# Patient Record
Sex: Male | Born: 1980 | Hispanic: Yes | Marital: Married | Smoking: Never smoker
Health system: Southern US, Community
[De-identification: ages and names within clinical notes are randomized; demographics above are authoritative.]

---

## 2013-10-11 DIAGNOSIS — Z0000 Encounter for general adult medical examination without abnormal findings: Secondary | ICD-10-CM | POA: Insufficient documentation

## 2014-10-07 ENCOUNTER — Telehealth: Payer: Self-pay | Admitting: General Practice

## 2014-10-07 NOTE — Telephone Encounter (Signed)
You were recommended to patient and he is wanting to establish care with you.  Please advise

## 2014-10-07 NOTE — Telephone Encounter (Signed)
LMOVM

## 2014-10-07 NOTE — Telephone Encounter (Signed)
yes

## 2014-10-08 ENCOUNTER — Ambulatory Visit (INDEPENDENT_AMBULATORY_CARE_PROVIDER_SITE_OTHER): Payer: BLUE CROSS/BLUE SHIELD | Admitting: Internal Medicine

## 2014-10-08 ENCOUNTER — Encounter: Payer: Self-pay | Admitting: Internal Medicine

## 2014-10-08 ENCOUNTER — Other Ambulatory Visit (INDEPENDENT_AMBULATORY_CARE_PROVIDER_SITE_OTHER): Payer: BLUE CROSS/BLUE SHIELD

## 2014-10-08 ENCOUNTER — Ambulatory Visit (INDEPENDENT_AMBULATORY_CARE_PROVIDER_SITE_OTHER)
Admission: RE | Admit: 2014-10-08 | Discharge: 2014-10-08 | Disposition: A | Discharge status: Home / Self Care | Payer: BLUE CROSS/BLUE SHIELD | Source: Ambulatory Visit | Attending: Internal Medicine | Admitting: Internal Medicine

## 2014-10-08 VITALS — BP 120/80 | HR 56 | Temp 98.0°F | Resp 16 | Ht 70.0 in | Wt 187.0 lb

## 2014-10-08 DIAGNOSIS — R51 Headache: Secondary | ICD-10-CM

## 2014-10-08 DIAGNOSIS — B351 Tinea unguium: Secondary | ICD-10-CM

## 2014-10-08 DIAGNOSIS — M79609 Pain in unspecified limb: Secondary | ICD-10-CM

## 2014-10-08 DIAGNOSIS — R519 Headache, unspecified: Secondary | ICD-10-CM

## 2014-10-08 LAB — CBC WITH DIFFERENTIAL/PLATELET
BASOS ABS: 0 10*3/uL (ref 0.0–0.1)
BASOS PCT: 0.4 % (ref 0.0–3.0)
EOS ABS: 0.1 10*3/uL (ref 0.0–0.7)
Eosinophils Relative: 2.3 % (ref 0.0–5.0)
HCT: 42.1 % (ref 39.0–52.0)
HEMOGLOBIN: 14.4 g/dL (ref 13.0–17.0)
Lymphocytes Relative: 36.8 % (ref 12.0–46.0)
Lymphs Abs: 2.2 10*3/uL (ref 0.7–4.0)
MCHC: 34.3 g/dL (ref 30.0–36.0)
MCV: 84.1 fl (ref 78.0–100.0)
MONO ABS: 0.5 10*3/uL (ref 0.1–1.0)
Monocytes Relative: 8.3 % (ref 3.0–12.0)
NEUTROS ABS: 3.2 10*3/uL (ref 1.4–7.7)
Neutrophils Relative %: 52.2 % (ref 43.0–77.0)
Platelets: 215 10*3/uL (ref 150.0–400.0)
RBC: 5.01 Mil/uL (ref 4.22–5.81)
RDW: 12.5 % (ref 11.5–15.5)
WBC: 6.1 10*3/uL (ref 4.0–10.5)

## 2014-10-08 LAB — COMPREHENSIVE METABOLIC PANEL
ALBUMIN: 4.1 g/dL (ref 3.5–5.2)
ALK PHOS: 39 U/L (ref 39–117)
ALT: 21 U/L (ref 0–53)
AST: 14 U/L (ref 0–37)
BILIRUBIN TOTAL: 0.6 mg/dL (ref 0.2–1.2)
BUN: 16 mg/dL (ref 6–23)
CALCIUM: 9.1 mg/dL (ref 8.4–10.5)
CHLORIDE: 106 meq/L (ref 96–112)
CO2: 27 mEq/L (ref 19–32)
CREATININE: 0.88 mg/dL (ref 0.40–1.50)
GFR: 104.36 mL/min (ref >60.00)
GLUCOSE: 89 mg/dL (ref 70–99)
Potassium: 3.9 mEq/L (ref 3.5–5.1)
SODIUM: 139 meq/L (ref 135–145)
Total Protein: 7.1 g/dL (ref 6.0–8.3)

## 2014-10-08 LAB — SEDIMENTATION RATE: Sed Rate: 10 mm/hr (ref 0–22)

## 2014-10-08 MED ORDER — EFINACONAZOLE 10 % EX SOLN: 1.0000 | Freq: Every day | CUTANEOUS | Status: DC

## 2014-10-08 MED ORDER — INDOMETHACIN 25 MG PO CAPS: 25.0000 mg | ORAL_CAPSULE | Freq: Two times a day (BID) | ORAL | Status: DC

## 2014-10-08 NOTE — Progress Notes (Signed)
Pre visit review using our clinic review tool, if applicable. No additional management support is needed unless otherwise documented below in the visit note. 

## 2014-10-08 NOTE — Progress Notes (Signed)
Subjective:  Patient ID: Alexander Mccarty, male    DOB: Mar 27, 1981  Age: 34 y.o. MRN: [id number]  CC: Headache   HPI Alexander Mccarty presents for a 2 week history of right-sided headache. He says that he awakens routinely at about 4 in the morning during the night with a severe right-sided headache. He says the pain is 7 on a scale to 10 and makes him afraid to go back to sleep. There is no pain during the day. He describes the pain as a throbbing and a tightness. Sometimes the pain radiates down to behind his right ear but it is predominantly located in his right parietal region. He has tried ibuprofen with some relief but has not taken ibuprofen last few days.  History Alexander Mccarty has no past medical history on file.   He has no past surgical history on file.   His family history includes Diabetes in his father and mother; Hypertension in his father and mother. There is no history of Cancer, Early death, Hyperlipidemia, Kidney disease, Stroke, or Alcohol abuse.He reports that he has never smoked. He has never used smokeless tobacco. He reports that he does not drink alcohol or use illicit drugs.  No outpatient prescriptions prior to visit.   No facility-administered medications prior to visit.    ROS Review of Systems  Constitutional: Negative.  Negative for fever, chills, diaphoresis, activity change, appetite change, fatigue and unexpected weight change.  HENT: Negative for congestion, rhinorrhea, sinus pressure, sore throat, trouble swallowing and voice change.   Eyes: Negative.  Negative for photophobia, pain, discharge, redness, itching and visual disturbance.  Respiratory: Negative.  Negative for cough, choking, chest tightness, shortness of breath and stridor.   Cardiovascular: Negative.  Negative for chest pain, palpitations and leg swelling.  Gastrointestinal: Negative.  Negative for nausea, vomiting, abdominal pain, diarrhea and constipation.  Endocrine: Negative.   Genitourinary:  Negative.   Musculoskeletal: Negative.  Negative for myalgias, back pain, joint swelling and arthralgias.  Skin: Negative.  Negative for rash.       He is concerned about the fingernail on his right index finger. It is looked abnormal for several months.  Allergic/Immunologic: Negative.   Neurological: Positive for headaches. Negative for dizziness, tremors, seizures, syncope, facial asymmetry, speech difficulty, weakness, light-headedness and numbness.  Hematological: Negative.  Negative for adenopathy. Does not bruise/bleed easily.  Psychiatric/Behavioral: Negative.  Negative for sleep disturbance, dysphoric mood, decreased concentration and agitation. The patient is not nervous/anxious.     Objective:  BP 120/80 mmHg  Pulse 56  Temp(Src) 98 F (36.7 C) (Oral)  Resp 16  Ht 5\' 10"  (1.778 m)  Wt 187 lb (84.823 kg)  BMI 26.83 kg/m2  SpO2 98%  Physical Exam  Constitutional: He is oriented to person, place, and time. He appears well-developed and well-nourished.  Non-toxic appearance. He does not have a sickly appearance. He does not appear ill. No distress.  HENT:  Head: Normocephalic and atraumatic.  Right Ear: Hearing, tympanic membrane, external ear and ear canal normal.  Left Ear: Hearing, tympanic membrane, external ear and ear canal normal.  Mouth/Throat: Oropharynx is clear and moist. No oropharyngeal exudate.  Eyes: Conjunctivae and EOM are normal. Pupils are equal, round, and reactive to light. Right eye exhibits no discharge. Left eye exhibits no discharge. No scleral icterus.  Neck: Normal range of motion. Neck supple. No JVD present. No tracheal deviation present. No thyromegaly present.  Cardiovascular: Normal rate, regular rhythm, normal heart sounds and intact distal pulses.  Exam  reveals no gallop and no friction rub.   No murmur heard. Pulmonary/Chest: Effort normal and breath sounds normal. No stridor. No respiratory distress. He has no wheezes. He has no rales. He  exhibits no tenderness.  Abdominal: Soft. Bowel sounds are normal. He exhibits no distension and no mass. There is no tenderness. There is no rebound and no guarding.  Musculoskeletal: Normal range of motion. He exhibits no edema or tenderness.  Lymphadenopathy:    He has no cervical adenopathy.  Neurological: He is alert and oriented to person, place, and time. He has normal strength. He displays no atrophy, no tremor and normal reflexes. No cranial nerve deficit or sensory deficit. He exhibits normal muscle tone. He displays a negative Romberg sign. He displays no seizure activity. Coordination and gait normal. He displays no Babinski's sign on the right side. He displays no Babinski's sign on the left side.  Reflex Scores:      Tricep reflexes are 1+ on the right side and 1+ on the left side.      Bicep reflexes are 1+ on the right side and 1+ on the left side.      Brachioradialis reflexes are 1+ on the right side and 1+ on the left side.      Patellar reflexes are 1+ on the right side and 1+ on the left side.      Achilles reflexes are 1+ on the right side and 1+ on the left side. Skin: Skin is warm and dry. No rash noted. He is not diaphoretic. No erythema. No pallor.  The fingernail on the right index finger is dystrophic. There is lysis and a scant amount of subungual debris. There is no abnormal pigmentation. There is no erythema, swelling, warmth, or exudate.  Psychiatric: He has a normal mood and affect. His behavior is normal. Judgment and thought content normal.  Vitals reviewed.   Lab Results  Component Value Date   WBC 6.1 10/08/2014   HGB 14.4 10/08/2014   HCT 42.1 10/08/2014   PLT 215.0 10/08/2014   GLUCOSE 89 10/08/2014   ALT 21 10/08/2014   AST 14 10/08/2014   NA 139 10/08/2014   K 3.9 10/08/2014   CL 106 10/08/2014   CREATININE 0.88 10/08/2014   BUN 16 10/08/2014   CO2 27 10/08/2014    Assessment & Plan:   Alexander Mccarty was seen today for headache.  Diagnoses and  all orders for this visit:  Headache, unspecified headache type - his exam, labs and CT scan are all normal. This is consistent with a hypnic headache. He will treat with caffeine in the way of a strong cup of coffee at bedtime and will start indomethacin one-two doses at bedtime. Orders: -     CBC with Differential/Platelet; Future -     Comprehensive metabolic panel; Future -     Sedimentation rate; Future -     CT Head Wo Contrast; Future -     indomethacin (INDOCIN) 25 MG capsule; Take 1 capsule (25 mg total) by mouth 2 (two) times daily with a meal.  Pain due to onychomycosis of nail - will start Jublia Orders: -     Efinaconazole (JUBLIA) 10 % SOLN; Apply 1 Act topically daily.   I am having Alexander Mccarty start on indomethacin and Efinaconazole.  Meds ordered this encounter  Medications  . indomethacin (INDOCIN) 25 MG capsule    Sig: Take 1 capsule (25 mg total) by mouth 2 (two) times daily with a meal.  Dispense:  60 capsule    Refill:  5  . Efinaconazole (JUBLIA) 10 % SOLN    Sig: Apply 1 Act topically daily.    Dispense:  4 mL    Refill:  11     Follow-up: No Follow-up on file.  Sanda Lingerhomas Josephus Harriger, MD

## 2015-11-03 ENCOUNTER — Encounter: Payer: Self-pay | Admitting: Internal Medicine

## 2015-11-03 ENCOUNTER — Ambulatory Visit (INDEPENDENT_AMBULATORY_CARE_PROVIDER_SITE_OTHER): Payer: BLUE CROSS/BLUE SHIELD | Admitting: Internal Medicine

## 2015-11-03 VITALS — BP 124/70 | HR 61 | Temp 98.5°F | Resp 14 | Ht 70.0 in | Wt 183.8 lb

## 2015-11-03 DIAGNOSIS — J0100 Acute maxillary sinusitis, unspecified: Secondary | ICD-10-CM

## 2015-11-03 MED ORDER — AMOXICILLIN-POT CLAVULANATE 875-125 MG PO TABS: 1.0000 | ORAL_TABLET | Freq: Two times a day (BID) | ORAL | 1 refills | Status: AC

## 2015-11-03 NOTE — Progress Notes (Signed)
Subjective:  Patient ID: Alexander Mccarty, male    DOB: 1980-09-16  Age: 35 y.o. MRN: [id number]  CC: Sinusitis   HPI Alexander Mccarty presents fora 3 week history of sinus pressure, thick yellow/green nasal phlegm, sore throat, nonproductive cough, and postnasal drip. He's been taking Sudafed which has provided some symptom relief.  Outpatient Medications Prior to Visit  Medication Sig Dispense Refill  . Efinaconazole (JUBLIA) 10 % SOLN Apply 1 Act topically daily. (Patient not taking: Reported on 11/03/2015) 4 mL 11  . indomethacin (INDOCIN) 25 MG capsule Take 1 capsule (25 mg total) by mouth 2 (two) times daily with a meal. (Patient not taking: Reported on 11/03/2015) 60 capsule 5   No facility-administered medications prior to visit.     ROS Review of Systems  Constitutional: Negative.  Negative for appetite change, chills and fatigue.  HENT: Positive for congestion, postnasal drip, rhinorrhea, sinus pressure and sore throat. Negative for facial swelling, sneezing, tinnitus, trouble swallowing and voice change.   Eyes: Negative.  Negative for visual disturbance.  Respiratory: Positive for cough. Negative for choking, chest tightness, shortness of breath, wheezing and stridor.   Cardiovascular: Negative.  Negative for chest pain, palpitations and leg swelling.  Gastrointestinal: Negative.  Negative for abdominal pain.  Endocrine: Negative.   Genitourinary: Negative.   Musculoskeletal: Negative.   Skin: Negative.  Negative for rash.  Allergic/Immunologic: Negative.   Neurological: Negative.   Hematological: Negative.  Negative for adenopathy. Does not bruise/bleed easily.  Psychiatric/Behavioral: Negative.     Objective:  BP 124/70   Pulse 61   Temp 98.5 F (36.9 C) (Oral)   Resp 14   Ht  (1.778 m)   Wt 183 lb 12.8 oz (83.4 kg)   SpO2 97%   BMI 26.37 kg/m   BP Readings from Last 3 Encounters:  11/03/15 124/70  10/08/14 120/80    Wt Readings from Last 3 Encounters:   11/03/15 183 lb 12.8 oz (83.4 kg)  10/08/14 187 lb (84.8 kg)    Physical Exam  Constitutional: He is oriented to person, place, and time. No distress.  HENT:  Right Ear: Hearing, external ear and ear canal normal.  Left Ear: Hearing, tympanic membrane, external ear and ear canal normal.  Nose: No mucosal edema or rhinorrhea. Right sinus exhibits maxillary sinus tenderness. Right sinus exhibits no frontal sinus tenderness. Left sinus exhibits no maxillary sinus tenderness and no frontal sinus tenderness.  Mouth/Throat: No trismus in the jaw. No uvula swelling. Posterior oropharyngeal erythema present. No oropharyngeal exudate, posterior oropharyngeal edema or tonsillar abscesses.  Eyes: Conjunctivae are normal. Right eye exhibits no discharge. Left eye exhibits no discharge. No scleral icterus.  Neck: Normal range of motion. Neck supple. No JVD present. No tracheal deviation present. No thyromegaly present.  Cardiovascular: Normal rate, regular rhythm, normal heart sounds and intact distal pulses.  Exam reveals no gallop and no friction rub.   No murmur heard. Pulmonary/Chest: Effort normal and breath sounds normal. No stridor. No respiratory distress. He has no wheezes. He has no rales. He exhibits no tenderness.  Abdominal: Soft. He exhibits no distension and no mass. There is no tenderness. There is no rebound and no guarding.  Musculoskeletal: Normal range of motion. He exhibits no edema, tenderness or deformity.  Lymphadenopathy:    He has no cervical adenopathy.  Neurological: He is oriented to person, place, and time.  Skin: Skin is warm and dry. No rash noted. He is not diaphoretic. No erythema.  Vitals reviewed.  Lab Results  Component Value Date   WBC 6.1 10/08/2014   HGB 14.4 10/08/2014   HCT 42.1 10/08/2014   PLT 215.0 10/08/2014   GLUCOSE 89 10/08/2014   ALT 21 10/08/2014   AST 14 10/08/2014   NA 139 10/08/2014   K 3.9 10/08/2014   CL 106 10/08/2014   CREATININE  0.88 10/08/2014   BUN 16 10/08/2014   CO2 27 10/08/2014    Ct Head Wo Contrast  Result Date: 10/08/2014 CLINICAL DATA:  Right site headache for 2 weeks EXAM: CT HEAD WITHOUT CONTRAST TECHNIQUE: Contiguous axial images were obtained from the base of the skull through the vertex without intravenous contrast. COMPARISON:  None. FINDINGS: No skull fracture is noted. Paranasal sinuses and mastoid air cells are unremarkable. No intracranial hemorrhage, mass effect or midline shift. No acute cortical infarction. No hydrocephalus. The gray and white-matter differentiation is preserved. No mass lesion is noted on this unenhanced scan. IMPRESSION: No acute intracranial abnormality. Electronically Signed   By: Natasha MeadLiviu  Pop M.D.   On: 10/08/2014 15:00    Assessment & Plan:   Alexander Mccarty was seen today for sinusitis.  Diagnoses and all orders for this visit:  Acute maxillary sinusitis, recurrence not specified -     amoxicillin-clavulanate (AUGMENTIN) 875-125 MG tablet; Take 1 tablet by mouth 2 (two) times daily.   I am having Alexander Mccarty start on amoxicillin-clavulanate. I am also having him maintain his indomethacin and Efinaconazole.  Meds ordered this encounter  Medications  . amoxicillin-clavulanate (AUGMENTIN) 875-125 MG tablet    Sig: Take 1 tablet by mouth 2 (two) times daily.    Dispense:  20 tablet    Refill:  1     Follow-up: Return if symptoms worsen or fail to improve.  Sanda Lingerhomas Federico Maiorino, MD

## 2015-11-03 NOTE — Progress Notes (Signed)
Pre visit review using our clinic review tool, if applicable. No additional management support is needed unless otherwise documented below in the visit note. 

## 2015-11-03 NOTE — Patient Instructions (Signed)

## 2016-03-17 DIAGNOSIS — M545 Low back pain: Secondary | ICD-10-CM | POA: Diagnosis not present

## 2016-03-17 DIAGNOSIS — M9902 Segmental and somatic dysfunction of thoracic region: Secondary | ICD-10-CM | POA: Diagnosis not present

## 2016-03-17 DIAGNOSIS — M546 Pain in thoracic spine: Secondary | ICD-10-CM | POA: Diagnosis not present

## 2016-03-17 DIAGNOSIS — M9903 Segmental and somatic dysfunction of lumbar region: Secondary | ICD-10-CM | POA: Diagnosis not present

## 2016-03-30 DIAGNOSIS — M546 Pain in thoracic spine: Secondary | ICD-10-CM | POA: Diagnosis not present

## 2016-03-30 DIAGNOSIS — M545 Low back pain: Secondary | ICD-10-CM | POA: Diagnosis not present

## 2016-03-30 DIAGNOSIS — M9902 Segmental and somatic dysfunction of thoracic region: Secondary | ICD-10-CM | POA: Diagnosis not present

## 2016-03-30 DIAGNOSIS — M9903 Segmental and somatic dysfunction of lumbar region: Secondary | ICD-10-CM | POA: Diagnosis not present

## 2016-04-05 ENCOUNTER — Encounter: Payer: Self-pay | Admitting: Internal Medicine

## 2016-04-06 ENCOUNTER — Ambulatory Visit (INDEPENDENT_AMBULATORY_CARE_PROVIDER_SITE_OTHER): Payer: BLUE CROSS/BLUE SHIELD | Admitting: Internal Medicine

## 2016-04-06 ENCOUNTER — Ambulatory Visit (INDEPENDENT_AMBULATORY_CARE_PROVIDER_SITE_OTHER)
Admission: RE | Admit: 2016-04-06 | Discharge: 2016-04-06 | Disposition: A | Discharge status: Home / Self Care | Payer: BLUE CROSS/BLUE SHIELD | Source: Ambulatory Visit | Attending: Internal Medicine | Admitting: Internal Medicine

## 2016-04-06 ENCOUNTER — Encounter: Payer: Self-pay | Admitting: Internal Medicine

## 2016-04-06 ENCOUNTER — Other Ambulatory Visit (INDEPENDENT_AMBULATORY_CARE_PROVIDER_SITE_OTHER): Payer: BLUE CROSS/BLUE SHIELD

## 2016-04-06 VITALS — BP 122/82 | HR 81 | Temp 98.7°F | Resp 16 | Ht 70.0 in | Wt 190.8 lb

## 2016-04-06 DIAGNOSIS — R93422 Abnormal radiologic findings on diagnostic imaging of left kidney: Secondary | ICD-10-CM | POA: Diagnosis not present

## 2016-04-06 DIAGNOSIS — J069 Acute upper respiratory infection, unspecified: Secondary | ICD-10-CM | POA: Insufficient documentation

## 2016-04-06 DIAGNOSIS — Z23 Encounter for immunization: Secondary | ICD-10-CM

## 2016-04-06 DIAGNOSIS — R05 Cough: Secondary | ICD-10-CM

## 2016-04-06 DIAGNOSIS — R1032 Left lower quadrant pain: Secondary | ICD-10-CM

## 2016-04-06 DIAGNOSIS — R109 Unspecified abdominal pain: Secondary | ICD-10-CM | POA: Diagnosis not present

## 2016-04-06 DIAGNOSIS — B9789 Other viral agents as the cause of diseases classified elsewhere: Secondary | ICD-10-CM

## 2016-04-06 DIAGNOSIS — R059 Cough, unspecified: Secondary | ICD-10-CM

## 2016-04-06 LAB — COMPREHENSIVE METABOLIC PANEL
ALT: 34 U/L (ref 0–53)
AST: 19 U/L (ref 0–37)
Albumin: 4.3 g/dL (ref 3.5–5.2)
Alkaline Phosphatase: 42 U/L (ref 39–117)
BILIRUBIN TOTAL: 0.5 mg/dL (ref 0.2–1.2)
BUN: 17 mg/dL (ref 6–23)
CHLORIDE: 106 meq/L (ref 96–112)
CO2: 28 meq/L (ref 19–32)
CREATININE: 0.89 mg/dL (ref 0.40–1.50)
Calcium: 9.3 mg/dL (ref 8.4–10.5)
GFR: 102.15 mL/min (ref >60.00)
GLUCOSE: 89 mg/dL (ref 70–99)
Potassium: 4.2 mEq/L (ref 3.5–5.1)
SODIUM: 141 meq/L (ref 135–145)
Total Protein: 7.3 g/dL (ref 6.0–8.3)

## 2016-04-06 LAB — CBC WITH DIFFERENTIAL/PLATELET
BASOS ABS: 0 10*3/uL (ref 0.0–0.1)
BASOS PCT: 0.6 % (ref 0.0–3.0)
EOS ABS: 0.1 10*3/uL (ref 0.0–0.7)
Eosinophils Relative: 2.1 % (ref 0.0–5.0)
HEMATOCRIT: 43.2 % (ref 39.0–52.0)
Hemoglobin: 14.9 g/dL (ref 13.0–17.0)
LYMPHS ABS: 3 10*3/uL (ref 0.7–4.0)
Lymphocytes Relative: 43.5 % (ref 12.0–46.0)
MCHC: 34.4 g/dL (ref 30.0–36.0)
MCV: 83.2 fl (ref 78.0–100.0)
Monocytes Absolute: 0.4 10*3/uL (ref 0.1–1.0)
Monocytes Relative: 6.5 % (ref 3.0–12.0)
NEUTROS ABS: 3.3 10*3/uL (ref 1.4–7.7)
NEUTROS PCT: 47.3 % (ref 43.0–77.0)
Platelets: 201 10*3/uL (ref 150.0–400.0)
RBC: 5.2 Mil/uL (ref 4.22–5.81)
RDW: 12.2 % (ref 11.5–15.5)
WBC: 6.9 10*3/uL (ref 4.0–10.5)

## 2016-04-06 LAB — URINALYSIS, ROUTINE W REFLEX MICROSCOPIC
BILIRUBIN URINE: NEGATIVE
Hgb urine dipstick: NEGATIVE
Ketones, ur: NEGATIVE
Leukocytes, UA: NEGATIVE
Nitrite: NEGATIVE
PH: 6.5 (ref 5.0–8.0)
RBC / HPF: NONE SEEN (ref 0–?)
SPECIFIC GRAVITY, URINE: 1.015 (ref 1.000–1.030)
TOTAL PROTEIN, URINE-UPE24: NEGATIVE
UROBILINOGEN UA: 0.2 (ref 0.0–1.0)
Urine Glucose: NEGATIVE
WBC UA: NONE SEEN (ref 0–?)

## 2016-04-06 LAB — AMYLASE: Amylase: 27 U/L (ref 27–131)

## 2016-04-06 LAB — SEDIMENTATION RATE: SED RATE: 8 mm/h (ref 0–15)

## 2016-04-06 LAB — LIPASE: LIPASE: 43 U/L (ref 11.0–59.0)

## 2016-04-06 LAB — C-REACTIVE PROTEIN: CRP: 0.1 mg/dL — ABNORMAL LOW (ref 0.5–20.0)

## 2016-04-06 MED ORDER — HYDROCODONE-HOMATROPINE 5-1.5 MG/5ML PO SYRP
5.0000 mL | ORAL_SOLUTION | Freq: Three times a day (TID) | ORAL | 0 refills | Status: DC | PRN
Start: 2016-04-06 — End: 2017-02-09

## 2016-04-06 NOTE — Progress Notes (Signed)
Subjective:  Patient ID: Alexander Mccarty, male    DOB: [birth year]  Age: 36 y.o. MRN: [id number]  CC: Cough and Abdominal Pain   HPI Alexander Mccarty presents for feeling poorly for about 2 weeks. 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 Nonetheless, he tells me with some back manipulations from the chiropractor his back pain is getting better but he has also had a mild, nonproductive cough and sneezing for about 10 days. The cough keeps him awake at night and aggravates his low back pain. He also reports a few episodes of discomfort in his left flank and left lower abdomen that he describes as a mild aching and tight sensation. He has had a few episodes of constipation but he denies loss of appetite, nausea, vomiting, diarrhea, rash, dysuria, hematuria, or lymphadenopathy. He has been taking Motrin for his discomforts and has gotten some relief.  Outpatient Medications Prior to Visit  Medication Sig Dispense Refill  . Efinaconazole (JUBLIA) 10 % SOLN Apply 1 Act topically daily. (Patient not taking: Reported on 11/03/2015) 4 mL 11  . indomethacin (INDOCIN) 25 MG capsule Take 1 capsule (25 mg total) by mouth 2 (two) times daily with a meal. (Patient not taking: Reported on 11/03/2015) 60 capsule 5   No facility-administered medications prior to visit.     ROS Review of Systems  Constitutional: Negative for activity change, appetite change, chills, diaphoresis, fatigue, fever and unexpected weight change.  HENT: Negative.  Negative for facial swelling, sinus pressure, sore throat and trouble swallowing.   Eyes: Negative for visual disturbance.  Respiratory: Positive for cough. Negative for chest tightness, shortness of breath and wheezing.   Cardiovascular: Negative.  Negative for chest pain, palpitations and leg  swelling.  Gastrointestinal: Positive for abdominal pain and constipation. Negative for abdominal distention, anal bleeding, blood in stool, diarrhea, nausea, rectal pain and vomiting.  Endocrine: Negative.   Genitourinary: Negative.  Negative for decreased urine volume, difficulty urinating, dysuria, flank pain, hematuria, penile swelling, scrotal swelling, testicular pain and urgency.  Musculoskeletal: Positive for back pain. Negative for arthralgias, joint swelling, myalgias and neck pain.  Skin: Negative.  Negative for color change, pallor and rash.  Allergic/Immunologic: Negative.   Neurological: Negative.  Negative for dizziness, weakness, light-headedness and headaches.  Hematological: Negative.  Negative for adenopathy. Does not bruise/bleed easily.  Psychiatric/Behavioral: Negative.     Objective:  BP 122/82   Pulse 81   Temp 98.7 F (37.1 C) (Oral)   Resp 16   Ht 5\' 10"  (1.778 m)   Wt 190 lb 12.8 oz (86.5 kg)   SpO2 98%   BMI 27.38 kg/m   BP Readings from Last 3 Encounters:  04/06/16 122/82  11/03/15 124/70  10/08/14 120/80    Wt Readings from Last 3 Encounters:  04/06/16 190 lb 12.8 oz (86.5 kg)  11/03/15 183 lb 12.8 oz (83.4 kg)  10/08/14 187 lb (84.8 kg)    Physical Exam  Constitutional: He is oriented to person, place, and time.  Non-toxic appearance. He does not have a sickly appearance. He does not appear ill. No distress.  HENT:  Mouth/Throat: Oropharynx is clear and moist. No oropharyngeal exudate.  Eyes: Conjunctivae are normal. Right eye exhibits no discharge. Left eye exhibits no discharge. No scleral icterus.  Neck: Normal range of motion. Neck supple. No JVD present. No tracheal deviation present. No thyromegaly present.  Cardiovascular: Normal rate, regular rhythm, normal heart sounds and intact distal pulses.  Exam reveals no gallop and no friction rub.   No murmur heard. Pulmonary/Chest: Effort normal and breath sounds normal. No stridor. No  respiratory distress. He has no wheezes. He has no rales. He exhibits no tenderness.  Abdominal: Soft. Normal appearance and bowel sounds are normal. He exhibits no shifting dullness, no distension, no pulsatile midline mass and no mass. There is no hepatosplenomegaly, splenomegaly or hepatomegaly. There is no tenderness. There is no rebound, no guarding and no CVA tenderness. No hernia. Hernia confirmed negative in the ventral area.  Musculoskeletal: Normal range of motion. He exhibits no edema, tenderness or deformity.  Lymphadenopathy:    He has no cervical adenopathy.  Neurological: He is oriented to person, place, and time.  Skin: Skin is warm and dry. No rash noted. He is not diaphoretic. No erythema. No pallor.  Psychiatric: He has a normal mood and affect. His behavior is normal. Judgment and thought content normal.  Vitals reviewed.   Lab Results  Component Value Date   WBC 6.9 04/06/2016   HGB 14.9 04/06/2016   HCT 43.2 04/06/2016   PLT 201.0 04/06/2016   GLUCOSE 89 04/06/2016   ALT 34 04/06/2016   AST 19 04/06/2016   NA 141 04/06/2016   K 4.2 04/06/2016   CL 106 04/06/2016   CREATININE 0.89 04/06/2016   BUN 17 04/06/2016   CO2 28 04/06/2016    Ct Head Wo Contrast  Result Date: 10/08/2014 CLINICAL DATA:  Right site headache for 2 weeks EXAM: CT HEAD WITHOUT CONTRAST TECHNIQUE: Contiguous axial images were obtained from the base of the skull through the vertex without intravenous contrast. COMPARISON:  None. FINDINGS: No skull fracture is noted. Paranasal sinuses and mastoid air cells are unremarkable. No intracranial hemorrhage, mass effect or midline shift. No acute cortical infarction. No hydrocephalus. The gray and white-matter differentiation is preserved. No mass lesion is noted on this unenhanced scan. IMPRESSION: No acute intracranial abnormality. Electronically Signed   By: Natasha Mead M.D.   On: 10/08/2014 15:00    Assessment & Plan:   [name] was seen today for  cough and abdominal pain.  Diagnoses and all orders for this visit:  Cough- his chest x-ray is normal, this is consistent with a viral URI. -     DG Abd Acute W/Chest; Future -     HYDROcodone-homatropine (HYCODAN) 5-1.5 MG/5ML syrup; Take 5 mLs by mouth every 8 (eight) hours as needed for cough.  Abdominal pain, left lower quadrant- his exam is normal, his labs are all normal, but his plain x-ray shows vague calcifications around the left kidney. I will get a CT/renal protocol to see if there is a mass in the left kidney or a kidney stone that could be causing his discomfort. Otherwise I do not see any evidence of abdominal pathology. -     Lipase; Future -     Comprehensive metabolic panel; Future -     CBC with Differential/Platelet; Future -     Amylase; Future -     Urinalysis, Routine w reflex microscopic; Future -     DG Abd Acute W/Chest; Future -     C-reactive protein; Future -     Sedimentation rate; Future -     CT RENAL STONE STUDY; Future  Viral upper respiratory tract infection with cough -  HYDROcodone-homatropine (HYCODAN) 5-1.5 MG/5ML syrup; Take 5 mLs by mouth every 8 (eight) hours as needed for cough.  Abnormal finding on diagnostic imaging of left kidney -     CT RENAL STONE STUDY; Future  Need for prophylactic vaccination and inoculation against influenza -     Flu Vaccine QUAD 36+ mos IM  Need for prophylactic vaccination with combined diphtheria-tetanus-pertussis (DTP) vaccine -     Tdap vaccine greater than or equal to 7yo IM   I have discontinued Mr. Rica MoteBosch's indomethacin, Efinaconazole, and Ibuprofen. I am also having him start on HYDROcodone-homatropine.  Meds ordered this encounter  Medications  . DISCONTD: Ibuprofen 200 MG CAPS  . HYDROcodone-homatropine (HYCODAN) 5-1.5 MG/5ML syrup    Sig: Take 5 mLs by mouth every 8 (eight) hours as needed for cough.    Dispense:  120 mL    Refill:  0     Follow-up: Return in about 3 weeks (around  04/27/2016).  Sanda Lingerhomas [place] Duquette, MD

## 2016-04-06 NOTE — Progress Notes (Signed)
Pre visit review using our clinic review tool, if applicable. No additional management support is needed unless otherwise documented below in the visit note. 

## 2016-04-06 NOTE — Patient Instructions (Signed)

## 2016-04-07 NOTE — Telephone Encounter (Signed)
Pt attached the images in the Review Media tab.

## 2016-04-08 ENCOUNTER — Other Ambulatory Visit: Payer: Self-pay | Admitting: Internal Medicine

## 2016-04-08 DIAGNOSIS — R93422 Abnormal radiologic findings on diagnostic imaging of left kidney: Secondary | ICD-10-CM

## 2016-04-08 DIAGNOSIS — R1032 Left lower quadrant pain: Secondary | ICD-10-CM

## 2016-04-09 ENCOUNTER — Inpatient Hospital Stay: Admission: RE | Admit: 2016-04-09 | Payer: BLUE CROSS/BLUE SHIELD | Source: Ambulatory Visit

## 2016-04-19 ENCOUNTER — Ambulatory Visit (INDEPENDENT_AMBULATORY_CARE_PROVIDER_SITE_OTHER)
Admission: RE | Admit: 2016-04-19 | Discharge: 2016-04-19 | Disposition: A | Discharge status: Home / Self Care | Payer: BLUE CROSS/BLUE SHIELD | Source: Ambulatory Visit | Attending: Internal Medicine | Admitting: Internal Medicine

## 2016-04-19 ENCOUNTER — Encounter: Payer: Self-pay | Admitting: Internal Medicine

## 2016-04-19 DIAGNOSIS — R93422 Abnormal radiologic findings on diagnostic imaging of left kidney: Secondary | ICD-10-CM | POA: Diagnosis not present

## 2016-04-19 DIAGNOSIS — R1032 Left lower quadrant pain: Secondary | ICD-10-CM | POA: Diagnosis not present

## 2016-04-19 MED ORDER — IOPAMIDOL (ISOVUE-300) INJECTION 61%
100.0000 mL | Freq: Once | INTRAVENOUS | Status: AC | PRN
  Administered 2016-04-19: 100 mL via INTRAVENOUS

## 2016-04-27 ENCOUNTER — Ambulatory Visit: Payer: BLUE CROSS/BLUE SHIELD | Admitting: Internal Medicine

## 2017-02-09 ENCOUNTER — Other Ambulatory Visit (INDEPENDENT_AMBULATORY_CARE_PROVIDER_SITE_OTHER): Payer: BLUE CROSS/BLUE SHIELD

## 2017-02-09 ENCOUNTER — Encounter: Payer: Self-pay | Admitting: Internal Medicine

## 2017-02-09 ENCOUNTER — Ambulatory Visit: Payer: BLUE CROSS/BLUE SHIELD | Admitting: Internal Medicine

## 2017-02-09 VITALS — BP 128/82 | HR 74 | Temp 98.4°F | Ht 70.0 in | Wt 189.0 lb

## 2017-02-09 DIAGNOSIS — Z7251 High risk heterosexual behavior: Secondary | ICD-10-CM

## 2017-02-09 DIAGNOSIS — Z23 Encounter for immunization: Secondary | ICD-10-CM

## 2017-02-09 DIAGNOSIS — R310 Gross hematuria: Secondary | ICD-10-CM

## 2017-02-09 DIAGNOSIS — Z0000 Encounter for general adult medical examination without abnormal findings: Secondary | ICD-10-CM

## 2017-02-09 DIAGNOSIS — N410 Acute prostatitis: Secondary | ICD-10-CM | POA: Diagnosis not present

## 2017-02-09 LAB — CBC WITH DIFFERENTIAL/PLATELET
BASOS ABS: 0 10*3/uL (ref 0.0–0.1)
Basophils Relative: 0.7 % (ref 0.0–3.0)
EOS ABS: 0.1 10*3/uL (ref 0.0–0.7)
Eosinophils Relative: 2.4 % (ref 0.0–5.0)
HEMATOCRIT: 46 % (ref 39.0–52.0)
HEMOGLOBIN: 15.4 g/dL (ref 13.0–17.0)
LYMPHS PCT: 44 % (ref 12.0–46.0)
Lymphs Abs: 2.1 10*3/uL (ref 0.7–4.0)
MCHC: 33.4 g/dL (ref 30.0–36.0)
MCV: 86.6 fl (ref 78.0–100.0)
MONO ABS: 0.4 10*3/uL (ref 0.1–1.0)
Monocytes Relative: 8.1 % (ref 3.0–12.0)
Neutro Abs: 2.1 10*3/uL (ref 1.4–7.7)
Neutrophils Relative %: 44.8 % (ref 43.0–77.0)
PLATELETS: 206 10*3/uL (ref 150.0–400.0)
RBC: 5.32 Mil/uL (ref 4.22–5.81)
RDW: 13.2 % (ref 11.5–15.5)
WBC: 4.8 10*3/uL (ref 4.0–10.5)

## 2017-02-09 LAB — URINALYSIS, ROUTINE W REFLEX MICROSCOPIC
BILIRUBIN URINE: NEGATIVE
KETONES UR: NEGATIVE
LEUKOCYTES UA: NEGATIVE
NITRITE: NEGATIVE
Specific Gravity, Urine: 1.015 (ref 1.000–1.030)
Total Protein, Urine: NEGATIVE
URINE GLUCOSE: NEGATIVE
UROBILINOGEN UA: 0.2 (ref 0.0–1.0)
WBC UA: NONE SEEN (ref 0–?)
pH: 6.5 (ref 5.0–8.0)

## 2017-02-09 LAB — LIPID PANEL
Cholesterol: 170 mg/dL (ref 0–200)
HDL: 35.3 mg/dL — AB (ref >39.00)
LDL Cholesterol: 120 mg/dL — ABNORMAL HIGH (ref 0–99)
NONHDL: 134.79
TRIGLYCERIDES: 76 mg/dL (ref 0.0–149.0)
Total CHOL/HDL Ratio: 5
VLDL: 15.2 mg/dL (ref 0.0–40.0)

## 2017-02-09 LAB — COMPREHENSIVE METABOLIC PANEL
ALBUMIN: 4.3 g/dL (ref 3.5–5.2)
ALT: 33 U/L (ref 0–53)
AST: 20 U/L (ref 0–37)
Alkaline Phosphatase: 33 U/L — ABNORMAL LOW (ref 39–117)
BILIRUBIN TOTAL: 0.8 mg/dL (ref 0.2–1.2)
BUN: 11 mg/dL (ref 6–23)
CALCIUM: 9.5 mg/dL (ref 8.4–10.5)
CHLORIDE: 103 meq/L (ref 96–112)
CO2: 29 meq/L (ref 19–32)
CREATININE: 0.87 mg/dL (ref 0.40–1.50)
GFR: 104.39 mL/min (ref >60.00)
Glucose, Bld: 97 mg/dL (ref 70–99)
Potassium: 3.7 mEq/L (ref 3.5–5.1)
SODIUM: 138 meq/L (ref 135–145)
Total Protein: 7.3 g/dL (ref 6.0–8.3)

## 2017-02-09 MED ORDER — DOXYCYCLINE MONOHYDRATE 100 MG PO CAPS: 100.0000 mg | ORAL_CAPSULE | Freq: Two times a day (BID) | ORAL | 1 refills | Status: AC

## 2017-02-09 NOTE — Progress Notes (Signed)
Subjective:  Patient ID: Alexander Mccarty, male    DOB: Sep 14, 1980  Age: 36 y.o. MRN: [id number]  CC: Other (Patient is here today for STD screening and states that he has this completed yearly.  On 11.08.2018 he had hematuria but has since resolve.  Would also like to disucss Gardisil Vaccine. Patient is not currently fasting.) and Annual Exam   HPI Alexander Mccarty presents for a CPX.  He had an episode of gross hematuria about a week ago.  He is not monogamous with his partner and is concerned about STI's.  He denies abdominal pain, flank pain, dysuria, genital lesions, nocturia, or frequency.  Outpatient Medications Prior to Visit  Medication Sig Dispense Refill  . HYDROcodone-homatropine (HYCODAN) 5-1.5 MG/5ML syrup Take 5 mLs by mouth every 8 (eight) hours as needed for cough. 120 mL 0   No facility-administered medications prior to visit.     ROS Review of Systems  Constitutional: Negative.  Negative for appetite change, chills, fatigue, fever and unexpected weight change.  HENT: Negative.  Negative for sinus pressure, sore throat and trouble swallowing.   Eyes: Negative.   Respiratory: Negative.  Negative for cough, chest tightness, shortness of breath and wheezing.   Cardiovascular: Negative for chest pain, palpitations and leg swelling.  Gastrointestinal: Negative for abdominal pain, diarrhea, nausea and vomiting.  Genitourinary: Positive for hematuria. Negative for difficulty urinating, discharge, dysuria, flank pain, genital sores, penile pain, penile swelling, scrotal swelling, testicular pain and urgency.  Musculoskeletal: Negative.  Negative for back pain.  Skin: Negative.   Neurological: Negative.   Hematological: Negative for adenopathy. Does not bruise/bleed easily.  Psychiatric/Behavioral: Negative.     Objective:  BP 128/82 (BP Location: Left Arm, Patient Position: Sitting, Cuff Size: Normal)   Pulse 74   Temp 98.4 F (36.9 C) (Oral)   Ht 5\' 10"  (1.778 m)   Wt  189 lb 0.6 oz (85.7 kg)   SpO2 98%   BMI 27.12 kg/m   BP Readings from Last 3 Encounters:  02/09/17 128/82  04/06/16 122/82  11/03/15 124/70    Wt Readings from Last 3 Encounters:  02/09/17 189 lb 0.6 oz (85.7 kg)  04/06/16 190 lb 12.8 oz (86.5 kg)  11/03/15 183 lb 12.8 oz (83.4 kg)    Physical Exam  Constitutional: No distress.  HENT:  Mouth/Throat: Oropharynx is clear and moist. No oropharyngeal exudate.  Eyes: Conjunctivae are normal. Right eye exhibits no discharge. Left eye exhibits no discharge. No scleral icterus.  Neck: Normal range of motion. Neck supple. No JVD present. No thyromegaly present.  Cardiovascular: Normal rate, regular rhythm and intact distal pulses. Exam reveals no gallop and no friction rub.  No murmur heard. Pulmonary/Chest: Effort normal and breath sounds normal. No respiratory distress. He has no wheezes. He has no rales. He exhibits no tenderness.  Abdominal: Soft. Bowel sounds are normal. He exhibits no distension and no mass. There is no tenderness. There is no rebound and no guarding. Hernia confirmed negative in the right inguinal area and confirmed negative in the left inguinal area.  Genitourinary: Testes normal and penis normal. Rectal exam shows no external hemorrhoid, no internal hemorrhoid, no fissure, no mass, no tenderness, anal tone normal and guaiac negative stool. Prostate is enlarged. Prostate is not tender. Right testis shows no mass, no swelling and no tenderness. Right testis is descended. Left testis shows no mass, no swelling and no tenderness. Left testis is descended. Circumcised. No penile erythema or penile tenderness. No discharge found.  Genitourinary Comments: Prostate gland, right lobe is larger than the left lobe and its boggy.  Lymphadenopathy:    He has no cervical adenopathy.       Right: No inguinal adenopathy present.       Left: No inguinal adenopathy present.  Skin: He is not diaphoretic.  Vitals reviewed.   Lab  Results  Component Value Date   WBC 4.8 02/09/2017   HGB 15.4 02/09/2017   HCT 46.0 02/09/2017   PLT 206.0 02/09/2017   GLUCOSE 97 02/09/2017   CHOL 170 02/09/2017   TRIG 76.0 02/09/2017   HDL 35.30 (L) 02/09/2017   LDLCALC 120 (H) 02/09/2017   ALT 33 02/09/2017   AST 20 02/09/2017   NA 138 02/09/2017   K 3.7 02/09/2017   CL 103 02/09/2017   CREATININE 0.87 02/09/2017   BUN 11 02/09/2017   CO2 29 02/09/2017    Ct Abdomen Pelvis W Wo Contrast  Result Date: 04/19/2016 CLINICAL DATA:  Left lower quadrant pain. Possible left renal calculi on recent abdominal radiograph. EXAM: CT ABDOMEN AND PELVIS WITHOUT AND WITH CONTRAST TECHNIQUE: Multidetector CT imaging of the abdomen and pelvis was performed following the standard protocol before and following the bolus administration of intravenous contrast. CONTRAST:  ISOVUE-300 IOPAMIDOL (ISOVUE-300) INJECTION 61% COMPARISON:  None. FINDINGS: Lower Chest: No acute findings. Hepatobiliary:  No masses identified. Gallbladder is unremarkable. Pancreas:  No mass or inflammatory changes. Spleen: Within normal limits in size and appearance. Adrenals/Urinary Tract: No adrenal masses identified. No evidence of urolithiasis or hydronephrosis. No complex cystic or solid renal masses are identified. Unopacified urinary bladder is unremarkable in appearance. Stomach/Bowel: No evidence of obstruction, inflammatory process or abnormal fluid collections. Normal appendix visualized. Vascular/Lymphatic: No pathologically enlarged lymph nodes. No abdominal aortic aneurysm. Reproductive:  No mass identified. Other:  None. Musculoskeletal:  No suspicious bone lesions identified. IMPRESSION: Negative. No evidence of urolithiasis, hydronephrosis, or other acute findings. Electronically Signed   By: Alexander Mccarty M.D.   On: 04/19/2016 16:16    Assessment & Plan:   Alexander Mccarty was seen today for other.  Diagnoses and all orders for this visit:  Routine general medical  examination at a health care facility- Exam completed, labs reviewed, vaccines reviewed and updated, patient education material was given. -     Lipid panel; Future  Gross hematuria- His urinalysis is positive for hematuria.  Screening for gonorrhea and chlamydia is negative.  Urine culture is pending.  Will empirically treat for acute prostatitis with doxycycline. -     Comprehensive metabolic panel; Future -     CBC with Differential/Platelet; Future -     Urinalysis, Routine w reflex microscopic; Future -     GC/Chlamydia Probe Amp; Future -     CULTURE, URINE COMPREHENSIVE; Future  High risk heterosexual behavior- Screening for sexually transmitted infections is negative.  He was advised regarding safe sexual practices.  He has no protective antibodies to Hep A and B so I asked him to return to start the vaccine process. -     Hepatitis A antibody, total; Future -     Hepatitis B core antibody, total; Future -     Hepatitis B surface antibody; Future -     HIV antibody; Future -     HSV(herpes simplex vrs) 1+2 ab-IgG; Future -     RPR; Future  Need for HPV vaccination -     HPV 9-valent vaccine,Recombinat   I have discontinued Alexander Mccarty "Marquie Antonio"'s HYDROcodone-homatropine. I  am also having him start on doxycycline.  Meds ordered this encounter  Medications  . doxycycline (MONODOX) 100 MG capsule    Sig: Take 1 capsule (100 mg total) 2 (two) times daily by mouth.    Dispense:  60 capsule    Refill:  1     Follow-up: No Follow-up on file.  Sanda Lingerhomas Maddock Finigan, MD

## 2017-02-09 NOTE — Patient Instructions (Signed)

## 2017-02-10 ENCOUNTER — Encounter: Payer: Self-pay | Admitting: Internal Medicine

## 2017-02-10 LAB — RPR: RPR: NONREACTIVE

## 2017-02-10 LAB — HSV(HERPES SIMPLEX VRS) I + II AB-IGG: HAV 1 IGG,TYPE SPECIFIC AB: <0.9 index

## 2017-02-10 LAB — HEPATITIS B CORE ANTIBODY, TOTAL: HEP B C TOTAL AB: NONREACTIVE

## 2017-02-10 LAB — HIV ANTIBODY (ROUTINE TESTING W REFLEX): HIV: NONREACTIVE

## 2017-02-10 LAB — GC/CHLAMYDIA PROBE AMP
Chlamydia trachomatis, NAA: NEGATIVE
Neisseria gonorrhoeae by PCR: NEGATIVE

## 2017-02-10 LAB — HEPATITIS A ANTIBODY, TOTAL: HEPATITIS A AB,TOTAL: NONREACTIVE

## 2017-02-10 LAB — HEPATITIS B SURFACE ANTIBODY,QUALITATIVE: Hep B S Ab: NONREACTIVE

## 2017-02-11 LAB — CULTURE, URINE COMPREHENSIVE
MICRO NUMBER: 81283914
RESULT: NO GROWTH
SPECIMEN QUALITY:: ADEQUATE

## 2017-02-22 ENCOUNTER — Ambulatory Visit: Payer: Self-pay | Admitting: Internal Medicine

## 2017-02-22 ENCOUNTER — Ambulatory Visit (INDEPENDENT_AMBULATORY_CARE_PROVIDER_SITE_OTHER): Payer: BLUE CROSS/BLUE SHIELD

## 2017-02-22 DIAGNOSIS — Z299 Encounter for prophylactic measures, unspecified: Secondary | ICD-10-CM

## 2017-02-22 DIAGNOSIS — Z23 Encounter for immunization: Secondary | ICD-10-CM

## 2017-03-25 ENCOUNTER — Ambulatory Visit (INDEPENDENT_AMBULATORY_CARE_PROVIDER_SITE_OTHER): Payer: BLUE CROSS/BLUE SHIELD

## 2017-03-25 DIAGNOSIS — Z299 Encounter for prophylactic measures, unspecified: Secondary | ICD-10-CM

## 2017-03-25 DIAGNOSIS — Z23 Encounter for immunization: Secondary | ICD-10-CM | POA: Diagnosis not present

## 2017-04-11 ENCOUNTER — Encounter: Payer: Self-pay | Admitting: Internal Medicine

## 2017-04-11 ENCOUNTER — Other Ambulatory Visit (INDEPENDENT_AMBULATORY_CARE_PROVIDER_SITE_OTHER): Payer: BLUE CROSS/BLUE SHIELD

## 2017-04-11 ENCOUNTER — Ambulatory Visit: Payer: BLUE CROSS/BLUE SHIELD | Admitting: Internal Medicine

## 2017-04-11 VITALS — BP 106/68 | HR 67 | Temp 98.1°F | Resp 16 | Ht 70.0 in | Wt 192.5 lb

## 2017-04-11 DIAGNOSIS — N410 Acute prostatitis: Secondary | ICD-10-CM

## 2017-04-11 DIAGNOSIS — Z23 Encounter for immunization: Secondary | ICD-10-CM

## 2017-04-11 LAB — URINALYSIS, ROUTINE W REFLEX MICROSCOPIC
BILIRUBIN URINE: NEGATIVE
HGB URINE DIPSTICK: NEGATIVE
Ketones, ur: NEGATIVE
LEUKOCYTES UA: NEGATIVE
NITRITE: NEGATIVE
RBC / HPF: NONE SEEN (ref 0–?)
SPECIFIC GRAVITY, URINE: 1.015 (ref 1.000–1.030)
Total Protein, Urine: NEGATIVE
URINE GLUCOSE: NEGATIVE
UROBILINOGEN UA: 0.2 (ref 0.0–1.0)
WBC, UA: NONE SEEN (ref 0–?)
pH: 7.5 (ref 5.0–8.0)

## 2017-04-11 NOTE — Patient Instructions (Signed)
Prostatitis Prostatitis is swelling or inflammation of the prostate gland. The prostate is a walnut-sized gland that is involved in the production of semen. It is located below a man's bladder, in front of the rectum. There are four types of prostatitis:  Chronic nonbacterial prostatitis. This is the most common type of prostatitis. It may be associated with a viral infection or autoimmune disorder.  Acute bacterial prostatitis. This is the least common type of prostatitis. It starts quickly and is usually associated with a bladder infection, high fever, and shaking chills. It can occur at any age.  Chronic bacterial prostatitis. This type usually results from acute bacterial prostatitis that happens repeatedly (is recurrent) or has not been treated properly. It can occur in men of any age but is most common among middle-aged men whose prostate has begun to get larger. The symptoms are not as severe as symptoms caused by acute bacterial prostatitis.  Prostatodynia or chronic pelvic pain syndrome (CPPS). This type is also called pelvic floor disorder. It is associated with increased muscular tone in the pelvis surrounding the prostate. What are the causes? Bacterial prostatitis is caused by infection from bacteria. Chronic nonbacterial prostatitis may be caused by:  Urinary tract infections (UTIs).  Nerve damage.  A response by the body's disease-fighting system (autoimmune response).  Chemicals in the urine. The causes of the other types of prostatitis are usually not known. What are the signs or symptoms? Symptoms of this condition vary depending upon the type of prostatitis. If you have acute bacterial prostatitis, you may experience:  Urinary symptoms, such as:  Painful urination.  Burning during urination.  Frequent and sudden urges to urinate.  Inability to start urinating.  A weak or interrupted stream of urine.  Vomiting.  Nausea.  Fever.  Chills.  Inability to  empty the bladder completely.  Pain in the:  Muscles or joints.  Lower back.  Lower abdomen. If you have any of the other types of prostatitis, you may experience:  Urinary symptoms, such as:  Sudden urges to urinate.  Frequent urination.  Difficulty starting urination.  Weak urine stream.  Dribbling after urination.  Discharge from the urethra. The urethra is a tube that opens at the end of the penis.  Pain in the:  Testicles.  Penis or tip of the penis.  Rectum.  Area in front of the rectum and below the scrotum (perineum).  Problems with sexual function.  Painful ejaculation.  Bloody semen. How is this diagnosed? This condition may be diagnosed based on:  A physical and medical exam.  Your symptoms.  A urine test to check for bacteria.  An exam in which a health care provider uses a finger to feel the prostate (digital rectal exam).  A test of a sample of semen.  Blood tests.  Ultrasound.  Removal of prostate tissue to be examined under a microscope (biopsy).  Tests to check how your body handles urine (urodynamic tests).  A test to look inside your bladder or urethra (cystoscopy). How is this treated? Treatment for this condition depends on the type of prostatitis. Treatment may involve:  Medicines to relieve pain or inflammation.  Medicines to help relax your muscles.  Physical therapy.  Heat therapy.  Techniques to help you control certain body functions (biofeedback).  Relaxation exercises.  Antibiotic medicine, if your condition is caused by bacteria.  Warm water baths (sitz baths). Sitz baths help with relaxing your pelvic floor muscles, which helps to relieve pressure on the prostate. Follow   these instructions at home:  Take over-the-counter and prescription medicines only as told by your health care provider.  If you were prescribed an antibiotic, take it as told by your health care provider. Do not stop taking the  antibiotic even if you start to feel better.  If physical therapy, biofeedback, or relaxation exercises were prescribed, do exercises as instructed.  Take sitz baths as directed by your health care provider. For a sitz bath, sit in warm water that is deep enough to cover your hips and buttocks.  Keep all follow-up visits as told by your health care provider. This is important. Contact a health care provider if:  Your symptoms get worse.  You have a fever. Get help right away if:  You have chills.  You feel nauseous.  You vomit.  You feel light-headed or feel like you are going to faint.  You are unable to urinate.  You have blood or blood clots in your urine. This information is not intended to replace advice given to you by your health care provider. Make sure you discuss any questions you have with your health care provider. Document Released: 03/12/2000 Document Revised: 12/04/2015 Document Reviewed: 12/04/2015 Elsevier Interactive Patient Education  2017 [employer]  

## 2017-04-11 NOTE — Progress Notes (Signed)
Subjective:  Patient ID: Alexander Mccarty, male    DOB: 09-26-1980  Age: 37 y.o. MRN: [id number]  CC: Urinary Tract Infection   HPI Alexander Mccarty presents for f/up - he recently completed a course of doxycycline for an episode of prostatitis with gross hematuria.  He tells me all of his symptoms have resolved.  He feels well today and offers no complaints.  No outpatient medications prior to visit.   No facility-administered medications prior to visit.     ROS Review of Systems  Constitutional: Negative for chills, fatigue and fever.  HENT: Negative.   Eyes: Negative.   Respiratory: Negative.  Negative for cough, chest tightness, shortness of breath and wheezing.   Cardiovascular: Negative for chest pain, palpitations and leg swelling.  Gastrointestinal: Negative for abdominal pain, constipation, diarrhea, nausea and vomiting.  Genitourinary: Negative for difficulty urinating, discharge, dysuria, flank pain, frequency, hematuria, scrotal swelling, testicular pain and urgency.  Musculoskeletal: Negative.  Negative for back pain.  Skin: Negative.  Negative for rash.  Allergic/Immunologic: Negative.   Neurological: Negative.  Negative for dizziness and weakness.  Hematological: Negative for adenopathy. Does not bruise/bleed easily.  Psychiatric/Behavioral: Negative.     Objective:  BP 106/68 (BP Location: Left Arm, Patient Position: Sitting, Cuff Size: Large)   Pulse 67   Temp 98.1 F (36.7 C) (Oral)   Resp 16   Ht 5\' 10"  (1.778 m)   Wt 192 lb 8 oz (87.3 kg)   SpO2 97%   BMI 27.62 kg/m   BP Readings from Last 3 Encounters:  04/11/17 106/68  02/09/17 128/82  04/06/16 122/82    Wt Readings from Last 3 Encounters:  04/11/17 192 lb 8 oz (87.3 kg)  02/09/17 189 lb 0.6 oz (85.7 kg)  04/06/16 190 lb 12.8 oz (86.5 kg)    Physical Exam  Constitutional: He is oriented to person, place, and time. No distress.  HENT:  Mouth/Throat: Oropharynx is clear and moist. No  oropharyngeal exudate.  Eyes: Conjunctivae are normal. No scleral icterus.  Neck: Normal range of motion. Neck supple. No JVD present. No thyromegaly present.  Cardiovascular: Normal rate, regular rhythm and normal heart sounds.  No murmur heard. Pulmonary/Chest: Effort normal and breath sounds normal. No respiratory distress. He has no wheezes. He has no rales.  Abdominal: Soft. Bowel sounds are normal. He exhibits no mass. There is no tenderness. There is no guarding.  Musculoskeletal: Normal range of motion. He exhibits no edema or tenderness.  Lymphadenopathy:    He has no cervical adenopathy.  Neurological: He is alert and oriented to person, place, and time.  Skin: Skin is warm and dry. He is not diaphoretic.  Vitals reviewed.   Lab Results  Component Value Date   WBC 4.8 02/09/2017   HGB 15.4 02/09/2017   HCT 46.0 02/09/2017   PLT 206.0 02/09/2017   GLUCOSE 97 02/09/2017   CHOL 170 02/09/2017   TRIG 76.0 02/09/2017   HDL 35.30 (L) 02/09/2017   LDLCALC 120 (H) 02/09/2017   ALT 33 02/09/2017   AST 20 02/09/2017   NA 138 02/09/2017   K 3.7 02/09/2017   CL 103 02/09/2017   CREATININE 0.87 02/09/2017   BUN 11 02/09/2017   CO2 29 02/09/2017    Ct Abdomen Pelvis W Wo Contrast  Result Date: 04/19/2016 CLINICAL DATA:  Left lower quadrant pain. Possible left renal calculi on recent abdominal radiograph. EXAM: CT ABDOMEN AND PELVIS WITHOUT AND WITH CONTRAST TECHNIQUE: Multidetector CT imaging of the abdomen and  pelvis was performed following the standard protocol before and following the bolus administration of intravenous contrast. CONTRAST:  100mL ISOVUE-300 IOPAMIDOL (ISOVUE-300) INJECTION 61% COMPARISON:  None. FINDINGS: Lower Chest: No acute findings. Hepatobiliary:  No masses identified. Gallbladder is unremarkable. Pancreas:  No mass or inflammatory changes. Spleen: Within normal limits in size and appearance. Adrenals/Urinary Tract: No adrenal masses identified. No evidence  of urolithiasis or hydronephrosis. No complex cystic or solid renal masses are identified. Unopacified urinary bladder is unremarkable in appearance. Stomach/Bowel: No evidence of obstruction, inflammatory process or abnormal fluid collections. Normal appendix visualized. Vascular/Lymphatic: No pathologically enlarged lymph nodes. No abdominal aortic aneurysm. Reproductive:  No mass identified. Other:  None. Musculoskeletal:  No suspicious bone lesions identified. IMPRESSION: Negative. No evidence of urolithiasis, hydronephrosis, or other acute findings. Electronically Signed   By: Myles RosenthalJohn  Stahl M.D.   On: 04/19/2016 16:16    Assessment & Plan:   Alexander HazardMatthew was seen today for urinary tract infection.  Diagnoses and all orders for this visit:  Acute prostatitis with hematuria- Based on his symptoms and UA this has resolved.  He will let me know if he has any recurrent symptoms. -     Cancel: Urinalysis, Routine w reflex microscopic; Future -     Urinalysis, Routine w reflex microscopic; Future  Need for HPV vaccination -     HPV 9-valent vaccine,Recombinat   Alexander AppMatthew Mccarty "Alexander Mccarty Mccarty" does not currently have medications on file.  No orders of the defined types were placed in this encounter.    Follow-up: Return in about 4 months (around 08/09/2017).  Sanda Lingerhomas Marijayne Rauth, MD

## 2017-06-03 ENCOUNTER — Ambulatory Visit: Payer: BLUE CROSS/BLUE SHIELD

## 2017-08-09 ENCOUNTER — Ambulatory Visit: Payer: BLUE CROSS/BLUE SHIELD | Admitting: Internal Medicine

## 2017-08-16 ENCOUNTER — Encounter: Payer: Self-pay | Admitting: Internal Medicine

## 2017-08-16 ENCOUNTER — Ambulatory Visit: Payer: BLUE CROSS/BLUE SHIELD | Admitting: Internal Medicine

## 2017-08-16 VITALS — BP 136/76 | HR 73 | Temp 98.3°F | Resp 16 | Ht 70.0 in | Wt 195.0 lb

## 2017-08-16 DIAGNOSIS — Z23 Encounter for immunization: Secondary | ICD-10-CM

## 2017-08-16 NOTE — Patient Instructions (Signed)

## 2017-08-16 NOTE — Progress Notes (Signed)
Subjective:  Patient ID: Alexander Mccarty, male    DOB: [birth year]  Age: 37 y.o. MRN: [id number]  CC: Follow-up   HPI Alexander Mccarty presents for f/up - he is due for his last vaccination against hep A, hep B, and HPV.  He feels well today and offers no complaints.  No outpatient medications prior to visit.   No facility-administered medications prior to visit.     ROS Review of Systems  All other systems reviewed and are negative.   Objective:  BP 136/76 (BP Location: Left Arm, Patient Position: Sitting, Cuff Size: Normal)   Pulse 73   Temp 98.3 F (36.8 C) (Oral)   Resp 16   Ht  (1.778 m)   Wt 195 lb (88.5 kg)   SpO2 97%   BMI 27.98 kg/m   BP Readings from Last 3 Encounters:  08/16/17 136/76  04/11/17 106/68  02/09/17 128/82    Wt Readings from Last 3 Encounters:  08/16/17 195 lb (88.5 kg)  04/11/17 192 lb 8 oz (87.3 kg)  02/09/17 189 lb 0.6 oz (85.7 kg)    Physical Exam  Constitutional: He is oriented to person, place, and time.  HENT:  Mouth/Throat: No oropharyngeal exudate.  Eyes: Conjunctivae are normal.  Neck: Normal range of motion. Neck supple.  Cardiovascular: Normal rate, regular rhythm and normal heart sounds.  No murmur heard. Pulmonary/Chest: Effort normal and breath sounds normal. No respiratory distress. He has no wheezes. He has no rales.  Abdominal: Soft. Bowel sounds are normal. He exhibits no mass. There is no tenderness.  Neurological: He is alert and oriented to person, place, and time.  Skin: Skin is warm and dry.  Vitals reviewed.   Lab Results  Component Value Date   WBC 4.8 02/09/2017   HGB 15.4 02/09/2017   HCT 46.0 02/09/2017   PLT 206.0 02/09/2017   GLUCOSE 97 02/09/2017   CHOL 170 02/09/2017   TRIG 76.0 02/09/2017   HDL 35.30 (L) 02/09/2017   LDLCALC 120 (H) 02/09/2017   ALT 33 02/09/2017   AST 20 02/09/2017   NA 138 02/09/2017   K 3.7 02/09/2017   CL 103 02/09/2017   CREATININE 0.87 02/09/2017   BUN 11  02/09/2017   CO2 29 02/09/2017    Ct Abdomen Pelvis W Wo Contrast  Result Date: 04/19/2016 CLINICAL DATA:  Left lower quadrant pain. Possible left renal calculi on recent abdominal radiograph. EXAM: CT ABDOMEN AND PELVIS WITHOUT AND WITH CONTRAST TECHNIQUE: Multidetector CT imaging of the abdomen and pelvis was performed following the standard protocol before and following the bolus administration of intravenous contrast. CONTRAST:  ISOVUE-300 IOPAMIDOL (ISOVUE-300) INJECTION 61% COMPARISON:  None. FINDINGS: Lower Chest: No acute findings. Hepatobiliary:  No masses identified. Gallbladder is unremarkable. Pancreas:  No mass or inflammatory changes. Spleen: Within normal limits in size and appearance. Adrenals/Urinary Tract: No adrenal masses identified. No evidence of urolithiasis or hydronephrosis. No complex cystic or solid renal masses are identified. Unopacified urinary bladder is unremarkable in appearance. Stomach/Bowel: No evidence of obstruction, inflammatory process or abnormal fluid collections. Normal appendix visualized. Vascular/Lymphatic: No pathologically enlarged lymph nodes. No abdominal aortic aneurysm. Reproductive:  No mass identified. Other:  None. Musculoskeletal:  No suspicious bone lesions identified. IMPRESSION: Negative. No evidence of urolithiasis, hydronephrosis, or other acute findings. Electronically Signed   By: Myles Rosenthal M.D.   On: 04/19/2016 16:16    Assessment & Plan:   Alexander Mccarty was seen today for follow-up.  Diagnoses and all orders  for this visit:  Need for HPV vaccination -     Cancel: HPV vaccine quadravalent 3 dose IM -     HPV 9-valent vaccine,Recombinat  Need for hepatitis A and B vaccination -     Hepatitis A hepatitis B combined vaccine IM   Alexander Mccarty "[facility]" does not currently have medications on file.  No orders of the defined types were placed in this encounter.    Follow-up: No follow-ups on file.  Sanda Linger, MD

## 2018-02-22 ENCOUNTER — Ambulatory Visit: Payer: BLUE CROSS/BLUE SHIELD | Admitting: Internal Medicine

## 2018-02-22 ENCOUNTER — Other Ambulatory Visit (INDEPENDENT_AMBULATORY_CARE_PROVIDER_SITE_OTHER): Payer: BLUE CROSS/BLUE SHIELD

## 2018-02-22 ENCOUNTER — Encounter: Payer: Self-pay | Admitting: Internal Medicine

## 2018-02-22 VITALS — BP 132/80 | HR 64 | Temp 97.9°F | Resp 16 | Ht 70.0 in | Wt 196.2 lb

## 2018-02-22 DIAGNOSIS — Z7251 High risk heterosexual behavior: Secondary | ICD-10-CM

## 2018-02-22 DIAGNOSIS — Z0000 Encounter for general adult medical examination without abnormal findings: Secondary | ICD-10-CM | POA: Diagnosis not present

## 2018-02-22 DIAGNOSIS — L409 Psoriasis, unspecified: Secondary | ICD-10-CM | POA: Diagnosis not present

## 2018-02-22 DIAGNOSIS — Z23 Encounter for immunization: Secondary | ICD-10-CM

## 2018-02-22 LAB — LIPID PANEL
Cholesterol: 187 mg/dL (ref 0–200)
HDL: 39.5 mg/dL (ref >39.00)
NonHDL: 147.51
TRIGLYCERIDES: 202 mg/dL — AB (ref 0.0–149.0)
Total CHOL/HDL Ratio: 5
VLDL: 40.4 mg/dL — ABNORMAL HIGH (ref 0.0–40.0)

## 2018-02-22 LAB — LDL CHOLESTEROL, DIRECT: Direct LDL: 135 mg/dL

## 2018-02-22 MED ORDER — CLOBETASOL PROPIONATE 0.05 % EX FOAM: Freq: Two times a day (BID) | CUTANEOUS | 0 refills | Status: DC

## 2018-02-22 NOTE — Patient Instructions (Signed)
Psoriasis Psoriasis is a long-term (chronic) condition of skin inflammation. It occurs because your immune system causes skin cells to form too quickly. As a result, too many skin cells grow and create raised, red patches (plaques) that look silvery on your skin. Plaques may appear anywhere on your body. They can be any size or shape. Psoriasis can come and go. The condition varies from mild to very severe. It cannot be passed from one person to another (not contagious). What are the causes? The cause of psoriasis is not known, but certain factors can make the condition worse. These include:  Damage or trauma to the skin, such as cuts, scrapes, sunburn, and dryness.  Lack of sunlight.  Certain medicines.  Alcohol.  Tobacco use.  Stress.  Infections caused by bacteria or viruses.  What increases the risk? This condition is more likely to develop in:  People with a family history of psoriasis.  People who are Caucasian.  People who are between the ages of 15-30 and 50-60 years old.  What are the signs or symptoms? There are five different types of psoriasis. You can have more than one type of psoriasis during your life. Types are:  Plaque.  Guttate.  Inverse.  Pustular.  Erythrodermic.  Each type of psoriasis has different symptoms.  Plaque psoriasis symptoms include red, raised plaques with a silvery white coating (scale). These plaques may be itchy. Your nails may be pitted and crumbly or fall off.  Guttate psoriasis symptoms include small red spots that often show up on your trunk, arms, and legs. These spots may develop after you have been sick, especially with strep throat.  Inverse psoriasis symptoms include plaques in your underarm area, under your breasts, or on your genitals, groin, or buttocks.  Pustular psoriasis symptoms include pus-filled bumps that are painful, red, and swollen on the palms of your hands or the soles of your feet. You also may feel  exhausted, feverish, weak, or have no appetite.  Erythrodermic psoriasis symptoms include bright red skin that may look burned. You may have a fast heartbeat and a body temperature that is too high or too low. You may be itchy or in pain.  How is this diagnosed? Your health care provider may suspect psoriasis based on your symptoms and family history. Your health care provider will also do a physical exam. This may include a procedure to remove a tissue sample (biopsy) for testing. You may also be referred to a health care provider who specializes in skin diseases (dermatologist). How is this treated? There is no cure for this condition, but treatment can help manage it. Goals of treatment include:  Helping your skin heal.  Reducing itching and inflammation.  Slowing the growth of new skin cells.  Helping your immune system respond better to your skin.  Treatment varies, depending on the severity of your condition. Treatment may include:  Creams or ointments.  Ultraviolet ray exposure (light therapy). This may include natural sunlight or light therapy in a medical office.  Medicines (systemic therapy). These medicines can help your body better manage skin cell turnover and inflammation. They may be used along with light therapy or ointments. You may also get antibiotic medicines if you have an infection.  Follow these instructions at home: Skin Care  Moisturize your skin as needed. Only use moisturizers that have been approved by your health care provider.  Apply cool compresses to the affected areas.  Do not scratch your skin. Lifestyle   Do not   use tobacco products. This includes cigarettes, chewing tobacco, and e-cigarettes. If you need help quitting, ask your health care provider.  Drink little or no alcohol.  Try techniques for stress reduction, such as meditation or yoga.  Get exposure to the sun as told by your health care provider. Do not get sunburned.  Consider  joining a psoriasis support group. Medicines  Take or use over-the-counter and prescription medicines only as told by your health care provider.  If you were prescribed an antibiotic, take or use it as told by your health care provider. Do not stop taking the antibiotic even if your condition starts to improve. General instructions  Keep a journal to help track what triggers an outbreak. Try to avoid any triggers.  See a counselor or social worker if feelings of sadness, frustration, and hopelessness about your condition are interfering with your work and relationships.  Keep all follow-up visits as told by your health care provider. This is important. Contact a health care provider if:  Your pain gets worse.  You have increasing redness or warmth in the affected areas.  You have new or worsening pain or stiffness in your joints.  Your nails start to break easily or pull away from the nail bed.  You have a fever.  You feel depressed. This information is not intended to replace advice given to you by your health care provider. Make sure you discuss any questions you have with your health care provider. Document Released: 03/12/2000 Document Revised: 08/21/2015 Document Reviewed: 07/31/2014 Elsevier Interactive Patient Education  2018 [employer]  

## 2018-02-22 NOTE — Progress Notes (Signed)
Subjective:  Patient ID: Alexander Mccarty, male    DOB: 1980/11/28  Age: 37 y.o. MRN: [id number]  CC: Rash and Annual Exam   HPI Alexander Mccarty presents for a CPX and rash.  He has had an intermittent rash for the last few months.  It is located over his elbows and both forearms.  It is mildly pruritic.  He had this before years ago and was treated treated successfully for psoriasis.  He also wants to be screened for sexually transmitted infections.  Other than the rash, he offers no other symptoms.  No outpatient medications prior to visit.   No facility-administered medications prior to visit.     ROS Review of Systems  Constitutional: Negative for chills, fatigue and fever.  HENT: Negative.  Negative for sore throat and trouble swallowing.   Eyes: Negative.   Respiratory: Negative.  Negative for cough, chest tightness, shortness of breath and wheezing.   Cardiovascular: Negative.   Gastrointestinal: Negative.  Negative for abdominal pain, diarrhea and nausea.  Endocrine: Negative.   Genitourinary: Negative.  Negative for difficulty urinating, discharge, dysuria, genital sores, penile pain, penile swelling, scrotal swelling and urgency.  Musculoskeletal: Negative.  Negative for arthralgias and back pain.  Skin: Positive for rash.  Neurological: Negative.  Negative for dizziness, weakness, light-headedness and headaches.  Hematological: Negative for adenopathy. Does not bruise/bleed easily.  Psychiatric/Behavioral: Negative.     Objective:  BP 132/80 (BP Location: Left Arm, Patient Position: Sitting, Cuff Size: Large)   Pulse 64   Temp 97.9 F (36.6 C) (Oral)   Resp 16   Ht 5\' 10"  (1.778 m)   Wt 196 lb 4 oz (89 kg)   SpO2 96%   BMI 28.16 kg/m   BP Readings from Last 3 Encounters:  02/22/18 132/80  08/16/17 136/76  04/11/17 106/68    Wt Readings from Last 3 Encounters:  02/22/18 196 lb 4 oz (89 kg)  08/16/17 195 lb (88.5 kg)  04/11/17 192 lb 8 oz (87.3 kg)     Physical Exam  Constitutional: He is oriented to person, place, and time. No distress.  HENT:  Mouth/Throat: Oropharynx is clear and moist. No oropharyngeal exudate.  Eyes: Conjunctivae are normal. No scleral icterus.  Neck: Normal range of motion. Neck supple. No JVD present. No thyromegaly present.  Cardiovascular: Normal rate, regular rhythm and normal heart sounds. Exam reveals no gallop.  No murmur heard. Pulmonary/Chest: Effort normal and breath sounds normal. No respiratory distress. He has no wheezes. He has no rales.  Abdominal: Soft. Bowel sounds are normal. He exhibits no mass. There is no hepatosplenomegaly. There is no tenderness.  Musculoskeletal: Normal range of motion. He exhibits no edema, tenderness or deformity.  Lymphadenopathy:    He has no cervical adenopathy.  Neurological: He is alert and oriented to person, place, and time.  Skin: Skin is warm and dry. No rash noted. He is not diaphoretic.  Over both elbows there are mildly erythematous plaques covered with silvery scale.  Extending over the dorsum of both forearms there are slightly erythematous papules with minuscule silvery scales.  All of these lesions are symmetrical.  Vitals reviewed.   Lab Results  Component Value Date   WBC 4.8 02/09/2017   HGB 15.4 02/09/2017   HCT 46.0 02/09/2017   PLT 206.0 02/09/2017   GLUCOSE 97 02/09/2017   CHOL 187 02/22/2018   TRIG 202.0 (H) 02/22/2018   HDL 39.50 02/22/2018   LDLDIRECT 135.0 02/22/2018   LDLCALC 120 (H) 02/09/2017  ALT 33 02/09/2017   AST 20 02/09/2017   NA 138 02/09/2017   K 3.7 02/09/2017   CL 103 02/09/2017   CREATININE 0.87 02/09/2017   BUN 11 02/09/2017   CO2 29 02/09/2017    Ct Abdomen Pelvis W Wo Contrast  Result Date: 04/19/2016 CLINICAL DATA:  Left lower quadrant pain. Possible left renal calculi on recent abdominal radiograph. EXAM: CT ABDOMEN AND PELVIS WITHOUT AND WITH CONTRAST TECHNIQUE: Multidetector CT imaging of the abdomen  and pelvis was performed following the standard protocol before and following the bolus administration of intravenous contrast. CONTRAST:  ISOVUE-300 IOPAMIDOL (ISOVUE-300) INJECTION 61% COMPARISON:  None. FINDINGS: Lower Chest: No acute findings. Hepatobiliary:  No masses identified. Gallbladder is unremarkable. Pancreas:  No mass or inflammatory changes. Spleen: Within normal limits in size and appearance. Adrenals/Urinary Tract: No adrenal masses identified. No evidence of urolithiasis or hydronephrosis. No complex cystic or solid renal masses are identified. Unopacified urinary bladder is unremarkable in appearance. Stomach/Bowel: No evidence of obstruction, inflammatory process or abnormal fluid collections. Normal appendix visualized. Vascular/Lymphatic: No pathologically enlarged lymph nodes. No abdominal aortic aneurysm. Reproductive:  No mass identified. Other:  None. Musculoskeletal:  No suspicious bone lesions identified. IMPRESSION: Negative. No evidence of urolithiasis, hydronephrosis, or other acute findings. Electronically Signed   By: Alexander Mccarty M.D.   On: 04/19/2016 16:16    Assessment & Plan:   Alexander Mccarty was seen today for rash and annual exam.  Diagnoses and all orders for this visit:  Routine general medical examination at a health care facility- Exam completed, labs reviewed, vaccines reviewed and updated, patient education material was given. -     Lipid panel; Future  High risk heterosexual behavior- Screening for STI are negative.  I have asked him to let me know if he wants to consider preexposure prophylaxis for HIV. -     HIV Antibody (routine testing w rflx); Future -     RPR; Future -     Cancel: GC/Chlamydia Probe Amp([facility]) -     GC/Chlamydia Probe Amp([facility]); Future  Psoriasis- Will treat this with a mid potency topical steroid foam. -     clobetasol (OLUX) 0.05 % topical foam; Apply topically 2 (two) times daily.  Other orders -     Flu Vaccine QUAD 36+  mos IM   I am having Alexander Mccarty "[name]" start on clobetasol.  Meds ordered this encounter  Medications  . clobetasol (OLUX) 0.05 % topical foam    Sig: Apply topically 2 (two) times daily.    Dispense:  50 g    Refill:  0     Follow-up: Return if symptoms worsen or fail to improve.  Sanda Linger, MD

## 2018-02-24 LAB — RPR: RPR: NONREACTIVE

## 2018-02-24 LAB — HIV ANTIBODY (ROUTINE TESTING W REFLEX): HIV 1&2 Ab, 4th Generation: NONREACTIVE

## 2018-02-24 LAB — GC/CHLAMYDIA PROBE AMP
Chlamydia trachomatis, NAA: NEGATIVE
Neisseria gonorrhoeae by PCR: NEGATIVE

## 2018-02-26 ENCOUNTER — Encounter: Payer: Self-pay | Admitting: Internal Medicine

## 2018-02-28 ENCOUNTER — Telehealth: Payer: Self-pay

## 2018-02-28 NOTE — Telephone Encounter (Signed)
Key: [id number]  PA has been approved. Message sent to pt mychart.

## 2018-04-03 DIAGNOSIS — M9902 Segmental and somatic dysfunction of thoracic region: Secondary | ICD-10-CM | POA: Diagnosis not present

## 2018-04-03 DIAGNOSIS — M9901 Segmental and somatic dysfunction of cervical region: Secondary | ICD-10-CM | POA: Diagnosis not present

## 2018-04-03 DIAGNOSIS — M9903 Segmental and somatic dysfunction of lumbar region: Secondary | ICD-10-CM | POA: Diagnosis not present

## 2018-05-02 DIAGNOSIS — M9901 Segmental and somatic dysfunction of cervical region: Secondary | ICD-10-CM | POA: Diagnosis not present

## 2018-05-02 DIAGNOSIS — M9902 Segmental and somatic dysfunction of thoracic region: Secondary | ICD-10-CM | POA: Diagnosis not present

## 2018-05-02 DIAGNOSIS — M9903 Segmental and somatic dysfunction of lumbar region: Secondary | ICD-10-CM | POA: Diagnosis not present

## 2018-05-23 DIAGNOSIS — M9901 Segmental and somatic dysfunction of cervical region: Secondary | ICD-10-CM | POA: Diagnosis not present

## 2018-05-23 DIAGNOSIS — M9903 Segmental and somatic dysfunction of lumbar region: Secondary | ICD-10-CM | POA: Diagnosis not present

## 2018-05-23 DIAGNOSIS — M9902 Segmental and somatic dysfunction of thoracic region: Secondary | ICD-10-CM | POA: Diagnosis not present

## 2018-06-06 ENCOUNTER — Ambulatory Visit: Payer: Self-pay | Admitting: Medical

## 2018-06-06 ENCOUNTER — Other Ambulatory Visit: Payer: Self-pay

## 2018-06-06 ENCOUNTER — Encounter: Payer: Self-pay | Admitting: Medical

## 2018-06-06 VITALS — BP 147/87 | HR 63 | Temp 97.8°F | Resp 16 | Wt 188.2 lb

## 2018-06-06 DIAGNOSIS — H5713 Ocular pain, bilateral: Secondary | ICD-10-CM

## 2018-06-06 MED ORDER — GENTAMICIN SULFATE 0.3 % OP OINT: TOPICAL_OINTMENT | Freq: Three times a day (TID) | OPHTHALMIC | 0 refills | Status: DC

## 2018-06-06 NOTE — Progress Notes (Signed)
   Subjective:    Patient ID: Alexander Mccarty, male    DOB: 1980-06-18, 38 y.o.   MRN: [id number]  HPI  38 yo male in non acute distress. Started  2 months ago  On the right eye lid using Zylet drops given by primary physician. Right eye lid is resolved and now the left eye lid presents with a bump on the lateral  side approximately  4 weeks. Rarely itches , no throbbing no pain. Denies fever or chills , denies  Headache or visual changes.   Has Daily contacts that are disposable.   Seen by PCP given Zylett . Used it 2 x/day x 7days and it seemed to resolve. Using it rearly. Because it Expired 10/2017. Blood pressure (!) 147/87, pulse 63, temperature 97.8 F (36.6 C), temperature source Tympanic, resp. rate 16, weight 188 lb 3.2 oz (85.4 kg), SpO2 99 %. No Known Allergies  Review of Systems  Eyes: Positive for photophobia (nothing new, he has a tendency to be light bothers him, this is nothing new.), redness (redness in the sclera in the left eye) and itching (mild itchiness on the right eye.). Negative for discharge (this morning some discharge form the left lateral side. ).  Neurological: Negative for headaches.    Objective:   Physical Exam Vitals signs and nursing note reviewed.  Constitutional:      Appearance: Normal appearance. He is normal weight.  HENT:     Mouth/Throat:     Mouth: Mucous membranes are moist.  Eyes:     General: Lids are everted, no foreign bodies appreciated. Vision grossly intact. Gaze aligned appropriately. No allergic shiner or scleral icterus.       Right eye: No foreign body or discharge.        Left eye: No foreign body or discharge.     Extraocular Movements: Extraocular movements intact.     Conjunctiva/sclera: Conjunctivae normal.     Pupils: Pupils are equal, round, and reactive to light.     Right eye: Pupil is round, reactive and not sluggish. No corneal abrasion or fluorescein uptake.     Left eye: Pupil is round, reactive and not sluggish. No  corneal abrasion or fluorescein uptake.     Funduscopic exam:    Right eye: No hemorrhage or exudate. Red reflex present.        Left eye: No hemorrhage or exudate. Red reflex present.  Neck:     Musculoskeletal: Normal range of motion.  Lymphadenopathy:     Cervical: No cervical adenopathy.    Eyes seem dry, he has been using steroid drops Zylet       Assessment & Plan:  Eye discomfort Left >Right swelling to the lateral side of left eye ? Eye gland infections on upper lids. Patient to wear glasses till seen by Ophthalmologist.  Meds ordered this encounter  Medications  . gentamicin (GENTAK) 0.3 % ophthalmic ointment    Sig: Place into both eyes 3 (three) times daily.    Dispense:  3.5 g    Refill:  0  Reviewed with patient to wash hands before and after applying eye ointment and how to place ointment in eye. Reviewed with patient that ointentment may cause a little blurry vision but that will resolve quickly. Blot do not rub eye. Follow up with [facility] in the next week. Patient already established with medical practice.  He verbalizes understanding and has no questions at discharge.

## 2018-06-06 NOTE — Patient Instructions (Signed)
Ophthalmologists Deirdre Evener, MD  Maylon Peppers Dingeldein, MD  Jerilee Field. Porfilio, MD  Amy M. Ether Griffins, MD  Leighton Parody, MD  Morton Stall, MD, MPH  Rudy Jew. Reggie Pile, MD  Lorenza Evangelist, MD Gentamicin eye ointment What is this medicine? GENTAMICIN (jen ta MYE sin) is an aminoglycoside antibiotic. It is used to treat certain kinds of bacterial eye infections. This medicine may be used for other purposes; ask your health care provider or pharmacist if you have questions. COMMON BRAND NAME(S): Garamycin, Genoptic SOP, Gentak, Ocu-Mycin What should I tell my health care provider before I take this medicine? They need to know if you have any of these conditions: -an unusual or allergic reaction to gentamicin or other antibiotics, sulfites, foods, dyes or preservatives -pregnant or trying to get pregnant -breast-feeding How should I use this medicine? This medicine is only for use in the eye. Do not take by mouth. Follow the directions on the prescription label. Wash hands before and after use. Tilt your head back slightly and pull your lower eyelid down with your index finger to form a pouch. Try not to touch the tip of the tube to your eye, fingertips, or other surface. Squeeze the end of the tube to apply a thin layer of the ointment to the inside of the lower eyelid. Close the eye gently to spread the ointment. Your vision may blur for a few minutes. Use your doses at regular intervals. It is important to use this medicine for the full course of treatment, even if you think your condition is better. Do not stop using except on the advice of your doctor or health care professional. Talk to your pediatrician regarding the use of this medicine in children. Special care may be needed. Overdosage: If you think you have taken too much of this medicine contact a poison control center or emergency room at once. NOTE: This medicine is only for you. Do not share this medicine with  others. What if I miss a dose? If you miss a dose, use it as soon as you can. If it is almost time for your next dose, use only that dose. Do not use double or extra doses. What may interact with this medicine? Interactions are not expected. Do not use any other eye products without talking to your doctor or health care professional. This list may not describe all possible interactions. Give your health care provider a list of all the medicines, herbs, non-prescription drugs, or dietary supplements you use. Also tell them if you smoke, drink alcohol, or use illegal drugs. Some items may interact with your medicine. What should I watch for while using this medicine? Tell your doctor or health care professional if your symptoms do not start to get better after a few days. A burning or stinging reaction that does not go away may mean you are allergic to this product. Stop using and call your doctor or health care professional. This medicine can make certain eye conditions worse. Only use it for conditions for which your doctor or health care professional has prescribed. To prevent the spread of infection, do not share eye products, towels, and washcloths with anyone else. What side effects may I notice from receiving this medicine? Side effects that you should report to your doctor or health care professional as soon as possible: -burning, stinging or irritation -difficulty hearing or ringing in the ears -dizziness -increased thirst -loss of balance -muscle weakness -nausea -pain or difficulty passing  urine Side effects that usually do not require medical attention (report to your doctor or health care professional if they continue or are bothersome): -blurred vision (usually temporary) This list may not describe all possible side effects. Call your doctor for medical advice about side effects. You may report side effects to FDA at 1-800-FDA-1088. Where should I keep my medicine? Keep out of  the reach of children. Store between 2 and 30 degrees C (36 and 86 degrees F). Do not freeze. Throw away any unused medicine after the expiration date. NOTE: This sheet is a summary. It may not cover all possible information. If you have questions about this medicine, talk to your doctor, pharmacist, or health care provider.  2019 Elsevier/Gold Standard (2007-07-26 12:17:58)

## 2018-06-07 ENCOUNTER — Telehealth: Payer: Self-pay | Admitting: Nurse Practitioner

## 2018-06-07 MED ORDER — ERYTHROMYCIN 5 MG/GM OP OINT: TOPICAL_OINTMENT | OPHTHALMIC | 0 refills | Status: DC

## 2018-06-07 NOTE — Telephone Encounter (Signed)
Erythromycin sent to pharmacy after speaking with [facility] about medication prescribed yesterday not available. Needs treatment per provider not that seen yesterday and sent med to pharmacy after reviewing chart.

## 2018-06-13 DIAGNOSIS — H04123 Dry eye syndrome of bilateral lacrimal glands: Secondary | ICD-10-CM | POA: Diagnosis not present

## 2018-06-20 DIAGNOSIS — M9901 Segmental and somatic dysfunction of cervical region: Secondary | ICD-10-CM | POA: Diagnosis not present

## 2018-06-20 DIAGNOSIS — M9903 Segmental and somatic dysfunction of lumbar region: Secondary | ICD-10-CM | POA: Diagnosis not present

## 2018-06-20 DIAGNOSIS — M9902 Segmental and somatic dysfunction of thoracic region: Secondary | ICD-10-CM | POA: Diagnosis not present

## 2019-01-23 ENCOUNTER — Other Ambulatory Visit (INDEPENDENT_AMBULATORY_CARE_PROVIDER_SITE_OTHER): Payer: BC Managed Care – PPO

## 2019-01-23 ENCOUNTER — Other Ambulatory Visit: Payer: Self-pay

## 2019-01-23 ENCOUNTER — Encounter: Payer: Self-pay | Admitting: Internal Medicine

## 2019-01-23 ENCOUNTER — Ambulatory Visit (INDEPENDENT_AMBULATORY_CARE_PROVIDER_SITE_OTHER)
Admission: RE | Admit: 2019-01-23 | Discharge: 2019-01-23 | Disposition: A | Discharge status: Home / Self Care | Payer: BC Managed Care – PPO | Source: Ambulatory Visit | Attending: Internal Medicine | Admitting: Internal Medicine

## 2019-01-23 ENCOUNTER — Ambulatory Visit (INDEPENDENT_AMBULATORY_CARE_PROVIDER_SITE_OTHER): Payer: BC Managed Care – PPO | Admitting: Internal Medicine

## 2019-01-23 VITALS — BP 130/80 | HR 60 | Temp 97.6°F | Resp 16 | Ht 70.0 in | Wt 196.8 lb

## 2019-01-23 DIAGNOSIS — M25561 Pain in right knee: Secondary | ICD-10-CM

## 2019-01-23 DIAGNOSIS — Z0000 Encounter for general adult medical examination without abnormal findings: Secondary | ICD-10-CM

## 2019-01-23 DIAGNOSIS — M25562 Pain in left knee: Secondary | ICD-10-CM

## 2019-01-23 DIAGNOSIS — M170 Bilateral primary osteoarthritis of knee: Secondary | ICD-10-CM | POA: Insufficient documentation

## 2019-01-23 DIAGNOSIS — Z7251 High risk heterosexual behavior: Secondary | ICD-10-CM

## 2019-01-23 LAB — CBC WITH DIFFERENTIAL/PLATELET
Basophils Absolute: 0 10*3/uL (ref 0.0–0.1)
Basophils Relative: 0.8 % (ref 0.0–3.0)
Eosinophils Absolute: 0.1 10*3/uL (ref 0.0–0.7)
Eosinophils Relative: 2.1 % (ref 0.0–5.0)
HCT: 44.3 % (ref 39.0–52.0)
Hemoglobin: 15.2 g/dL (ref 13.0–17.0)
Lymphocytes Relative: 46.7 % — ABNORMAL HIGH (ref 12.0–46.0)
Lymphs Abs: 2.7 10*3/uL (ref 0.7–4.0)
MCHC: 34.4 g/dL (ref 30.0–36.0)
MCV: 85.9 fl (ref 78.0–100.0)
Monocytes Absolute: 0.3 10*3/uL (ref 0.1–1.0)
Monocytes Relative: 5.9 % (ref 3.0–12.0)
Neutro Abs: 2.5 10*3/uL (ref 1.4–7.7)
Neutrophils Relative %: 44.5 % (ref 43.0–77.0)
Platelets: 190 10*3/uL (ref 150.0–400.0)
RBC: 5.16 Mil/uL (ref 4.22–5.81)
RDW: 12.6 % (ref 11.5–15.5)
WBC: 5.7 10*3/uL (ref 4.0–10.5)

## 2019-01-23 LAB — LIPID PANEL
Cholesterol: 195 mg/dL (ref 0–200)
HDL: 29.5 mg/dL — ABNORMAL LOW (ref >39.00)
LDL Cholesterol: 132 mg/dL — ABNORMAL HIGH (ref 0–99)
NonHDL: 165.82
Total CHOL/HDL Ratio: 7
Triglycerides: 170 mg/dL — ABNORMAL HIGH (ref 0.0–149.0)
VLDL: 34 mg/dL (ref 0.0–40.0)

## 2019-01-23 LAB — SEDIMENTATION RATE: Sed Rate: 7 mm/hr (ref 0–15)

## 2019-01-23 MED ORDER — MELOXICAM 15 MG PO TABS: 15.0000 mg | ORAL_TABLET | Freq: Every day | ORAL | 1 refills | Status: DC

## 2019-01-23 NOTE — Patient Instructions (Signed)

## 2019-01-23 NOTE — Progress Notes (Signed)
Subjective:  Patient ID: Alexander Mccarty, male    DOB: 1980-12-26  Age: 38 y.o. MRN: [id number]  CC: Annual Exam and Knee Pain   HPI Eagan Shifflett presents for a CPX.  He complains of a 3-week history of bilateral knee discomfort worse on the right than the left.  He is getting minimal symptom relief with Motrin.  The pain bothers him mostly during the day and he describes a stiffness with activity.  He also has some discomfort that interferes with his sleep.  He has not noticed any redness or swelling in his knees.  He tells me that none of his other joints bother him.  He denies any history of trauma or injury.  He wants to be tested for HIV.  Outpatient Medications Prior to Visit  Medication Sig Dispense Refill  . erythromycin ophthalmic ointment 1 application to affected eye(s) three times a day for 7 days. 3.5 g 0   No facility-administered medications prior to visit.     ROS Review of Systems  Constitutional: Negative for chills, fatigue and fever.  HENT: Negative.   Eyes: Negative.   Respiratory: Negative for cough, shortness of breath and wheezing.   Cardiovascular: Negative for chest pain, palpitations and leg swelling.  Gastrointestinal: Negative for abdominal pain, constipation, diarrhea, nausea and vomiting.  Endocrine: Negative.   Genitourinary: Negative.  Negative for difficulty urinating.  Musculoskeletal: Positive for arthralgias. Negative for back pain and neck pain.  Skin: Negative for color change and rash.  Neurological: Negative.  Negative for dizziness and weakness.  Hematological: Negative for adenopathy. Does not bruise/bleed easily.  Psychiatric/Behavioral: Negative.     Objective:  BP 130/80 (BP Location: Left Arm, Patient Position: Sitting, Cuff Size: Normal)   Pulse 60   Temp 97.6 F (36.4 C) (Oral)   Resp 16   Ht 5\' 10"  (1.778 m)   Wt 196 lb 12 oz (89.2 kg)   SpO2 97%   BMI 28.23 kg/m   BP Readings from Last 3 Encounters:  01/23/19  130/80  06/06/18 (!) 147/87  02/22/18 132/80    Wt Readings from Last 3 Encounters:  01/23/19 196 lb 12 oz (89.2 kg)  06/06/18 188 lb 3.2 oz (85.4 kg)  02/22/18 196 lb 4 oz (89 kg)    Physical Exam Vitals signs reviewed.  Constitutional:      Appearance: Normal appearance.  HENT:     Nose: Nose normal.  Eyes:     General: No scleral icterus.    Conjunctiva/sclera: Conjunctivae normal.  Neck:     Musculoskeletal: Normal range of motion and neck supple.  Cardiovascular:     Rate and Rhythm: Normal rate and regular rhythm.     Heart sounds: No murmur.  Pulmonary:     Effort: Pulmonary effort is normal.     Breath sounds: No stridor. No wheezing, rhonchi or rales.  Abdominal:     General: Abdomen is flat. Bowel sounds are normal.     Palpations: There is no hepatomegaly, splenomegaly or mass.     Tenderness: There is no abdominal tenderness.  Musculoskeletal: Normal range of motion.     Right knee: Normal. He exhibits normal range of motion, no swelling, no effusion, no ecchymosis, no deformity, no erythema, normal alignment and no bony tenderness. No tenderness found.     Left knee: He exhibits normal range of motion, no swelling, no effusion, no ecchymosis, no deformity, no erythema, normal alignment and no bony tenderness. No tenderness found.  Right lower leg: No edema.     Left lower leg: No edema.  Lymphadenopathy:     Cervical: No cervical adenopathy.  Skin:    General: Skin is warm and dry.     Findings: No rash.  Neurological:     General: No focal deficit present.     Mental Status: He is alert.     Lab Results  Component Value Date   WBC 5.7 01/23/2019   HGB 15.2 01/23/2019   HCT 44.3 01/23/2019   PLT 190.0 01/23/2019   GLUCOSE 97 02/09/2017   CHOL 195 01/23/2019   TRIG 170.0 (H) 01/23/2019   HDL 29.50 (L) 01/23/2019   LDLDIRECT 135.0 02/22/2018   LDLCALC 132 (H) 01/23/2019   ALT 33 02/09/2017   AST 20 02/09/2017   NA 138 02/09/2017   K 3.7  02/09/2017   CL 103 02/09/2017   CREATININE 0.87 02/09/2017   BUN 11 02/09/2017   CO2 29 02/09/2017    Ct Abdomen Pelvis W Wo Contrast  Result Date: 04/19/2016 CLINICAL DATA:  Left lower quadrant pain. Possible left renal calculi on recent abdominal radiograph. EXAM: CT ABDOMEN AND PELVIS WITHOUT AND WITH CONTRAST TECHNIQUE: Multidetector CT imaging of the abdomen and pelvis was performed following the standard protocol before and following the bolus administration of intravenous contrast. CONTRAST:  100mL ISOVUE-300 IOPAMIDOL (ISOVUE-300) INJECTION 61% COMPARISON:  None. FINDINGS: Lower Chest: No acute findings. Hepatobiliary:  No masses identified. Gallbladder is unremarkable. Pancreas:  No mass or inflammatory changes. Spleen: Within normal limits in size and appearance. Adrenals/Urinary Tract: No adrenal masses identified. No evidence of urolithiasis or hydronephrosis. No complex cystic or solid renal masses are identified. Unopacified urinary bladder is unremarkable in appearance. Stomach/Bowel: No evidence of obstruction, inflammatory process or abnormal fluid collections. Normal appendix visualized. Vascular/Lymphatic: No pathologically enlarged lymph nodes. No abdominal aortic aneurysm. Reproductive:  No mass identified. Other:  None. Musculoskeletal:  No suspicious bone lesions identified. IMPRESSION: Negative. No evidence of urolithiasis, hydronephrosis, or other acute findings. Electronically Signed   By: Myles RosenthalJohn  Stahl M.D.   On: 04/19/2016 16:16    Dg Knee Complete 4 Views Left  Result Date: 01/24/2019 CLINICAL DATA:  Pain EXAM: LEFT KNEE - COMPLETE 4+ VIEW COMPARISON:  None. FINDINGS: No evidence of fracture, dislocation, or joint effusion. No evidence of arthropathy or other focal bone abnormality. Soft tissues are unremarkable. IMPRESSION: Negative. Electronically Signed   By: Katherine Mantlehristopher  Green M.D.   On: 01/24/2019 01:13   Dg Knee Complete 4 Views Right  Result Date: 01/24/2019  CLINICAL DATA:  Pain EXAM: RIGHT KNEE - COMPLETE 4+ VIEW COMPARISON:  None. FINDINGS: No evidence of fracture, dislocation, or joint effusion. No evidence of arthropathy or other focal bone abnormality. Soft tissues are unremarkable. IMPRESSION: Negative. Electronically Signed   By: Katherine Mantlehristopher  Green M.D.   On: 01/24/2019 01:13    Assessment & Plan:   Molli HazardMatthew was seen today for annual exam and knee pain.  Diagnoses and all orders for this visit:  Acute pain of both knees- Based on his symptoms, normal x-rays, and reassuring labs I do not see any evidence of an inflammatory arthropathy.  Will treat for osteoarthritis with a once daily dose of meloxicam. -     CBC with Differential/Platelet; Future -     Sedimentation rate; Future -     DG Knee Complete 4 Views Right; Future -     DG Knee Complete 4 Views Left; Future  High risk heterosexual behavior -  HIV Antibody (routine testing w rflx); Future  Routine general medical examination at a health care facility- Exam completed, labs reviewed, vaccines reviewed, patient education was given. -     Lipid panel; Future -     HIV Antibody (routine testing w rflx); Future  Primary osteoarthritis of both knees -     meloxicam (MOBIC) 15 MG tablet; Take 1 tablet (15 mg total) by mouth daily.   I have discontinued Dorna Mai "Rodman Key Antonio"'s erythromycin. I am also having him start on meloxicam.  Meds ordered this encounter  Medications  . meloxicam (MOBIC) 15 MG tablet    Sig: Take 1 tablet (15 mg total) by mouth daily.    Dispense:  90 tablet    Refill:  1     Follow-up: Return if symptoms worsen or fail to improve.  Scarlette Calico, MD

## 2019-01-24 ENCOUNTER — Encounter: Payer: Self-pay | Admitting: Internal Medicine

## 2019-01-24 LAB — HIV ANTIBODY (ROUTINE TESTING W REFLEX): HIV 1&2 Ab, 4th Generation: NONREACTIVE

## 2019-04-05 DIAGNOSIS — M542 Cervicalgia: Secondary | ICD-10-CM | POA: Diagnosis not present

## 2019-04-05 DIAGNOSIS — M9901 Segmental and somatic dysfunction of cervical region: Secondary | ICD-10-CM | POA: Diagnosis not present

## 2019-04-05 DIAGNOSIS — M5137 Other intervertebral disc degeneration, lumbosacral region: Secondary | ICD-10-CM | POA: Diagnosis not present

## 2019-04-05 DIAGNOSIS — M9903 Segmental and somatic dysfunction of lumbar region: Secondary | ICD-10-CM | POA: Diagnosis not present

## 2019-04-05 DIAGNOSIS — M9902 Segmental and somatic dysfunction of thoracic region: Secondary | ICD-10-CM | POA: Diagnosis not present

## 2019-04-09 DIAGNOSIS — M9903 Segmental and somatic dysfunction of lumbar region: Secondary | ICD-10-CM | POA: Diagnosis not present

## 2019-04-09 DIAGNOSIS — M5137 Other intervertebral disc degeneration, lumbosacral region: Secondary | ICD-10-CM | POA: Diagnosis not present

## 2019-04-09 DIAGNOSIS — M9901 Segmental and somatic dysfunction of cervical region: Secondary | ICD-10-CM | POA: Diagnosis not present

## 2019-04-09 DIAGNOSIS — M542 Cervicalgia: Secondary | ICD-10-CM | POA: Diagnosis not present

## 2019-04-12 DIAGNOSIS — M542 Cervicalgia: Secondary | ICD-10-CM | POA: Diagnosis not present

## 2019-04-12 DIAGNOSIS — M9901 Segmental and somatic dysfunction of cervical region: Secondary | ICD-10-CM | POA: Diagnosis not present

## 2019-04-12 DIAGNOSIS — M9903 Segmental and somatic dysfunction of lumbar region: Secondary | ICD-10-CM | POA: Diagnosis not present

## 2019-04-12 DIAGNOSIS — M5137 Other intervertebral disc degeneration, lumbosacral region: Secondary | ICD-10-CM | POA: Diagnosis not present

## 2019-06-03 ENCOUNTER — Ambulatory Visit: Payer: Self-pay | Attending: Internal Medicine

## 2019-06-03 DIAGNOSIS — Z23 Encounter for immunization: Secondary | ICD-10-CM | POA: Insufficient documentation

## 2019-06-03 NOTE — Progress Notes (Signed)
   Covid-19 Vaccination Clinic  Name:  Lavell Supple    MRN: [id number] DOB: 01-26-1981  06/03/2019  Mr. Mauch was observed post Covid-19 immunization for 15 minutes without incident. He was provided with Vaccine Information Sheet and instruction to access the V-Safe system.   Mr. Craton was instructed to call 911 with any severe reactions post vaccine: Marland Kitchen Difficulty breathing  . Swelling of face and throat  . A fast heartbeat  . A bad rash all over body  . Dizziness and weakness   Immunizations Administered    Name Date Dose VIS Date Route   Pfizer COVID-19 Vaccine 06/03/2019 12:16 PM 0.3 mL 03/09/2019 Intramuscular   Manufacturer: [employer], [employer]   Lot: [id number]   NDC: [address]-8

## 2019-06-26 ENCOUNTER — Encounter: Payer: Self-pay | Admitting: Internal Medicine

## 2019-06-26 ENCOUNTER — Other Ambulatory Visit: Payer: Self-pay

## 2019-06-26 ENCOUNTER — Ambulatory Visit: Payer: BC Managed Care – PPO | Admitting: Internal Medicine

## 2019-06-26 VITALS — BP 128/70 | HR 66 | Temp 98.2°F | Resp 16 | Ht 70.0 in | Wt 205.0 lb

## 2019-06-26 DIAGNOSIS — H16001 Unspecified corneal ulcer, right eye: Secondary | ICD-10-CM | POA: Insufficient documentation

## 2019-06-26 MED ORDER — CIPROFLOXACIN HCL 0.3 % OP SOLN: 2.0000 [drp] | OPHTHALMIC | 0 refills | Status: DC

## 2019-06-26 NOTE — Patient Instructions (Signed)
Corneal Ulcer °The cornea is the clear covering at the front and center of the eye. The cornea protects the eye and helps to focus vision. A corneal ulcer is an open sore on the cornea. Corneal ulcers can lead to scarring. A scarred cornea can affect vision. Corneal ulcers may cause permanent damage if they are not treated. °What are the causes? °The most common causes of this condition is an infection. The infection may be caused by: °· Bacteria. °· Viruses. °· Fungi. °· Parasites. °Factors that can lead to this condition include: °· A foreign body in the eye, such as sand, glass, or small pieces of metal. °· Dry eye syndrome. °· Certain conditions that prevent the eyelids from closing completely, such as Bell's palsy. °· An eye injury. °· Contact lenses. °What increases the risk? °The following factors may make you more likely to develop this condition: °· Wearing your contact lenses for too long, wearing them overnight, or not taking care of them properly. °· Having a weakened disease-fighting (immune) system. °· Having a history of cold sores, chicken pox, or shingles around the eye. °· Using steroid eye drops. °What are the signs or symptoms? °Symptoms of this condition include: °· Eye pain. The pain is often severe. °· Blurry vision and sensitivity to light. °· Pus or thick discharge coming from your eye. °· Eye redness. °· Feeling like something is in your eye, or having a watery eye. °· A burning or stinging feeling. °When ulcers get large, they may be seen as a white spot on the cornea. °How is this diagnosed? °This condition is diagnosed based on your symptoms and medical history as well as an eye exam. Your health care provider may use a type of microscope (slit lamp) to examine your cornea. Eye drops may be put into your eye to make the ulcer easier to see. °Tissue samples or cultures from the eye may be done to see if an infection is causing the corneal ulcer. Numbing eye drops will be given before any  samples or cultures are taken. The samples or cultures will be checked in the lab for organisms such as bacteria, viruses, fungi, or parasites. °How is this treated? °Treatment for this condition depends on the cause. You may be given antibiotic eye drops until your health care provider knows the test results. At first, the antibiotic eye drops may be given very frequently, sometimes even around the clock. Other treatments may include: °· Antibiotic medicines by mouth, or medicines in the form of ointments or eye drops, to treat infections caused by bacteria, viruses, fungi or parasites. °· Over-the-counter or prescription pain medicine. °· Steroid eye drops if the eye is inflamed and swollen. °· An injection of medicine under the thin membrane covering the eyeball (conjunctiva). This allows medicine to reach the ulcer in high doses. °· Removal of the foreign body that caused the eye injury. °· Artificial tears or a bandage contact lens if severe dry eyes caused the corneal ulcer. °· Wearing an eye patch, if told by your heath care provider. °If the corneal ulcer causes a scar on the cornea that interferes with vision, surgery may be needed to replace the cornea (corneal transplant) long after the infection has resolved. °Follow these instructions at home: °Medicines °· Take or apply any over-the-counter and prescription medicines only as told by your health care provider. °· If you were prescribed an antibiotic medicine, including pills, eye drops, or ointment, use it as told by your health care   provider. Do not stop using the antibiotic even if you start to feel better. °? You may have to apply eye drops every few minutes to every hour for several days. °? It may be necessary to set an alarm every few minutes during the day to every hour during the night so that you can apply eye drops. °· Use artificial tears as needed if you have dry eyes. °· Ask your health care provider if the medicine prescribed to  you: °? Requires you to avoid driving or using heavy machinery. °? Can cause constipation. You may need to take these actions to prevent or treat constipation: °§ Drink enough fluid to keep your urine pale yellow. °§ Take over-the-counter or prescription medicine. °§ Eat foods that are high in fiber, such as beans, whole grains, and fresh fruits and vegetables. °§ Limit foods that are high in fat and processed sugars, such as fried or sweet foods. °Lifestyle °· Avoid wearing eye makeup. °· Avoid bright lights. Use sunglasses if you have light sensitivity. °· Do not wear contact lenses until your health care provider approves. °· Do not drive or operate heavy machinery until your health care provider approves. Do not drive or use machinery while wearing an eye patch. Your ability to judge distances will be impaired. °General instructions °· Do not touch or rub your eye. This may increase the irritation and spread the infection. °· If directed, wear your eye patch as told. °· Apply cool packs to your eye to relieve discomfort and swelling. °· If you have an infection, wash your hands often with soap and water. If soap and water are not available, use hand sanitizer. °· Keep all follow-up visits as told by your health care provider. This is important. °How is this prevented? °· If you normally wear contact lenses: °? Do not wear contact lenses while you sleep. °? Do not swim while wearing contact lenses. °? Wash your hands before removing your contact lenses. °? Properly sterilize and store your contact lenses. °? Regularly clean your contact lens case. °? Do not use your saliva or tap water to clean or wet your contact lenses. °? Remove your contact lenses if your eyes become irritated. You may put them back in once your eyes feel better. °Where to find more information °· American Academy of Ophthalmology: www.aao.org °Contact a health care provider if: °· Your pain gets worse. °· You have more discharge coming from  your eye. °Get help right away if: °· You have a change in vision. °· Your eyelids or the skin around them develops worse redness or swelling. °Summary °· A corneal ulcer is an open sore on the cornea that can cause corneal scarring and vision problems. °· Severe eye pain is a common symptom of a corneal ulcer. °· Treatment for a corneal ulcer depends on the cause. It may include pain medicines and antibiotic medicines by mouth, or medicines in the form of ointments or eye drops, to treat infections caused by bacteria, viruses, fungi, or parasites. °This information is not intended to replace advice given to you by your health care provider. Make sure you discuss any questions you have with your health care provider. °Document Revised: 10/18/2018 Document Reviewed: 10/18/2018 °Elsevier Patient Education © 2020 [employer] ° °

## 2019-06-26 NOTE — Progress Notes (Signed)
Subjective:  Patient ID: Alexander Mccarty, male    DOB: 09-06-1980  Age: 39 y.o. MRN: [id number]  CC: Eye Pain  This visit occurred during the SARS-CoV-2 public health emergency.  Safety protocols were in place, including screening questions prior to the visit, additional usage of staff PPE, and extensive cleaning of exam room while observing appropriate contact time as indicated for disinfecting solutions.    HPI Alexander Mccarty presents for a 2-day history of foreign body sensation in his right eye with redness, green discharge, and itching.  He had some leftover erythromycin ointment so he has been using it and says the symptoms have improved.  He wears contact lenses.  He denies photophobia, headache, rash, or lymphadenopathy.  Outpatient Medications Prior to Visit  Medication Sig Dispense Refill  . meloxicam (MOBIC) 15 MG tablet Take 1 tablet (15 mg total) by mouth daily. 90 tablet 1   No facility-administered medications prior to visit.    ROS Review of Systems  Constitutional: Negative.  Negative for chills and fatigue.  HENT: Negative.  Negative for sore throat.   Eyes: Positive for pain, discharge, redness and itching. Negative for photophobia.  Respiratory: Negative for cough, chest tightness, shortness of breath and wheezing.   Cardiovascular: Negative for chest pain, palpitations and leg swelling.  Gastrointestinal: Negative for abdominal pain, diarrhea and nausea.  Endocrine: Negative.   Genitourinary: Negative.   Musculoskeletal: Negative.  Negative for arthralgias and myalgias.  Skin: Negative for color change and rash.  Neurological: Negative for dizziness and light-headedness.  Hematological: Negative for adenopathy. Does not bruise/bleed easily.  Psychiatric/Behavioral: Negative.     Objective:  BP 128/70 (BP Location: Left Arm, Patient Position: Sitting, Cuff Size: Large)   Pulse 66   Temp 98.2 F (36.8 C) (Oral)   Resp 16   Ht 5\' 10"  (1.778 m)   Wt 205 lb  (93 kg)   SpO2 97%   BMI 29.41 kg/m   BP Readings from Last 3 Encounters:  06/26/19 128/70  01/23/19 130/80  06/06/18 (!) 147/87    Wt Readings from Last 3 Encounters:  06/26/19 205 lb (93 kg)  01/23/19 196 lb 12 oz (89.2 kg)  06/06/18 188 lb 3.2 oz (85.4 kg)    Physical Exam Constitutional:      Appearance: He is not ill-appearing.  HENT:     Mouth/Throat:     Mouth: Mucous membranes are moist.  Eyes:     General: Lids are normal. Vision grossly intact. No scleral icterus.       Right eye: No foreign body, discharge or hordeolum.        Left eye: No foreign body, discharge or hordeolum.     Extraocular Movements: Extraocular movements intact.     Conjunctiva/sclera: Conjunctivae normal.     Right eye: Right conjunctiva is not injected. No chemosis, exudate or hemorrhage.    Left eye: Left conjunctiva is not injected. No chemosis, exudate or hemorrhage.    Pupils: Pupils are equal, round, and reactive to light.  Cardiovascular:     Rate and Rhythm: Normal rate and regular rhythm.     Heart sounds: No murmur.  Pulmonary:     Effort: Pulmonary effort is normal.     Breath sounds: No wheezing, rhonchi or rales.  Abdominal:     General: Abdomen is flat.  Musculoskeletal:        General: Normal range of motion.  Lymphadenopathy:     Head:     Right side of  head: No preauricular, posterior auricular or occipital adenopathy.     Left side of head: No preauricular, posterior auricular or occipital adenopathy.     Cervical: No cervical adenopathy.     Right cervical: No superficial cervical adenopathy.    Left cervical: No superficial cervical adenopathy.  Skin:    General: Skin is warm and dry.     Findings: No rash.  Neurological:     General: No focal deficit present.     Mental Status: He is alert.     Lab Results  Component Value Date   WBC 5.7 01/23/2019   HGB 15.2 01/23/2019   HCT 44.3 01/23/2019   PLT 190.0 01/23/2019   GLUCOSE 97 02/09/2017   CHOL 195  01/23/2019   TRIG 170.0 (H) 01/23/2019   HDL 29.50 (L) 01/23/2019   LDLDIRECT 135.0 02/22/2018   LDLCALC 132 (H) 01/23/2019   ALT 33 02/09/2017   AST 20 02/09/2017   NA 138 02/09/2017   K 3.7 02/09/2017   CL 103 02/09/2017   CREATININE 0.87 02/09/2017   BUN 11 02/09/2017   CO2 29 02/09/2017    DG Knee Complete 4 Views Left  Result Date: 01/24/2019 CLINICAL DATA:  Pain EXAM: LEFT KNEE - COMPLETE 4+ VIEW COMPARISON:  None. FINDINGS: No evidence of fracture, dislocation, or joint effusion. No evidence of arthropathy or other focal bone abnormality. Soft tissues are unremarkable. IMPRESSION: Negative. Electronically Signed   By: Katherine Mantle M.D.   On: 01/24/2019 01:13   DG Knee Complete 4 Views Right  Result Date: 01/24/2019 CLINICAL DATA:  Pain EXAM: RIGHT KNEE - COMPLETE 4+ VIEW COMPARISON:  None. FINDINGS: No evidence of fracture, dislocation, or joint effusion. No evidence of arthropathy or other focal bone abnormality. Soft tissues are unremarkable. IMPRESSION: Negative. Electronically Signed   By: Katherine Mantle M.D.   On: 01/24/2019 01:13    Assessment & Plan:   Alexander Mccarty was seen today for eye pain.  Diagnoses and all orders for this visit:  Corneal ulcer of right eye- The exam is normal but his symptoms are c/w corneal ulcer. I think a fluroquinolone eye drop would be more effective.  -     Discontinue: ciprofloxacin (CILOXAN) 0.3 % ophthalmic solution; Place 2 drops into the right eye every 4 (four) hours while awake. Administer 1 drop, every 2 hours, while awake, for 2 days. Then 1 drop, every 4 hours, while awake, for the next 5 days. -     ciprofloxacin (CILOXAN) 0.3 % ophthalmic solution; Place 2 drops into the right eye every 4 (four) hours while awake.   I have discontinued Alexander App "Mykel Antonio"'s meloxicam. I have also changed his ciprofloxacin.  Meds ordered this encounter  Medications  . DISCONTD: ciprofloxacin (CILOXAN) 0.3 % ophthalmic  solution    Sig: Place 2 drops into the right eye every 4 (four) hours while awake. Administer 1 drop, every 2 hours, while awake, for 2 days. Then 1 drop, every 4 hours, while awake, for the next 5 days.    Dispense:  5 mL    Refill:  0  . ciprofloxacin (CILOXAN) 0.3 % ophthalmic solution    Sig: Place 2 drops into the right eye every 4 (four) hours while awake.    Dispense:  5 mL    Refill:  0     Follow-up: Return if symptoms worsen or fail to improve.  Sanda Linger, MD

## 2019-06-27 MED ORDER — CIPROFLOXACIN HCL 0.3 % OP SOLN: 2.0000 [drp] | OPHTHALMIC | 0 refills | Status: AC

## 2019-07-04 ENCOUNTER — Ambulatory Visit: Payer: BC Managed Care – PPO | Attending: Internal Medicine

## 2019-07-04 DIAGNOSIS — Z23 Encounter for immunization: Secondary | ICD-10-CM

## 2019-07-04 NOTE — Progress Notes (Signed)
   Covid-19 Vaccination Clinic  Name:  Daven Montz    MRN: [id number] DOB: 09/01/1980  07/04/2019  Mr. Broad was observed post Covid-19 immunization for 15 minutes without incident. He was provided with Vaccine Information Sheet and instruction to access the V-Safe system.   Mr. Regas was instructed to call 911 with any severe reactions post vaccine: Marland Kitchen Difficulty breathing  . Swelling of face and throat  . A fast heartbeat  . A bad rash all over body  . Dizziness and weakness   Immunizations Administered    Name Date Dose VIS Date Route   Pfizer COVID-19 Vaccine 07/04/2019  3:40 PM 0.3 mL 03/09/2019 Intramuscular   Manufacturer: [employer], [employer]   Lot: [id number]   NDC: [address]-5

## 2019-07-17 ENCOUNTER — Other Ambulatory Visit: Payer: Self-pay | Admitting: Internal Medicine

## 2019-07-17 DIAGNOSIS — M170 Bilateral primary osteoarthritis of knee: Secondary | ICD-10-CM

## 2021-03-31 IMAGING — DX DG KNEE COMPLETE 4+V*L*
4 series · 4 of 4 positions shown · non-contrast
Comparison: None.

CLINICAL DATA: Pain

EXAM:
LEFT KNEE - COMPLETE 4+ VIEW

[knee ap]
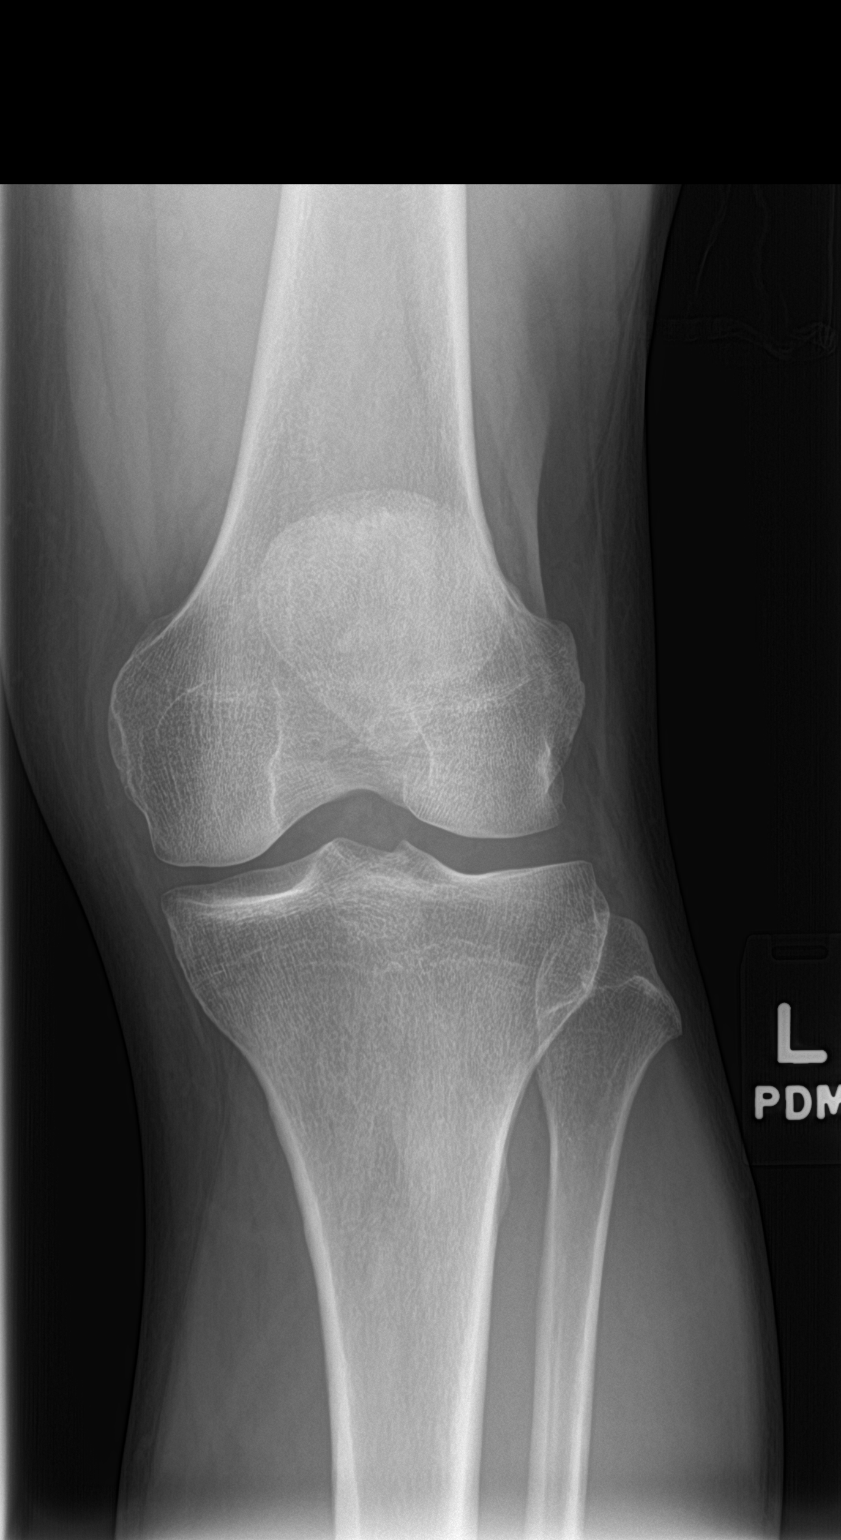

[knee tunnel]
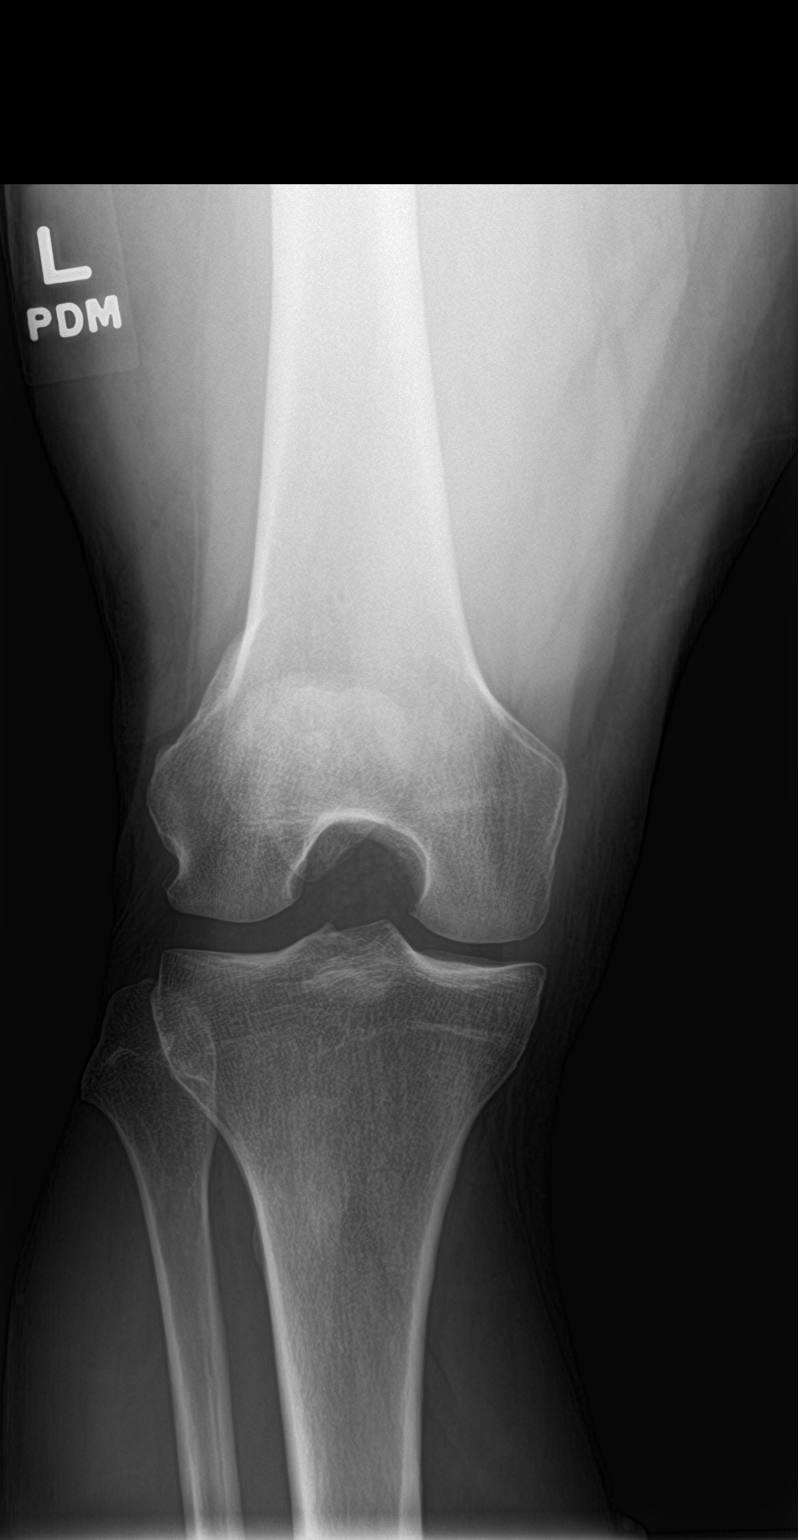

[knee lat]
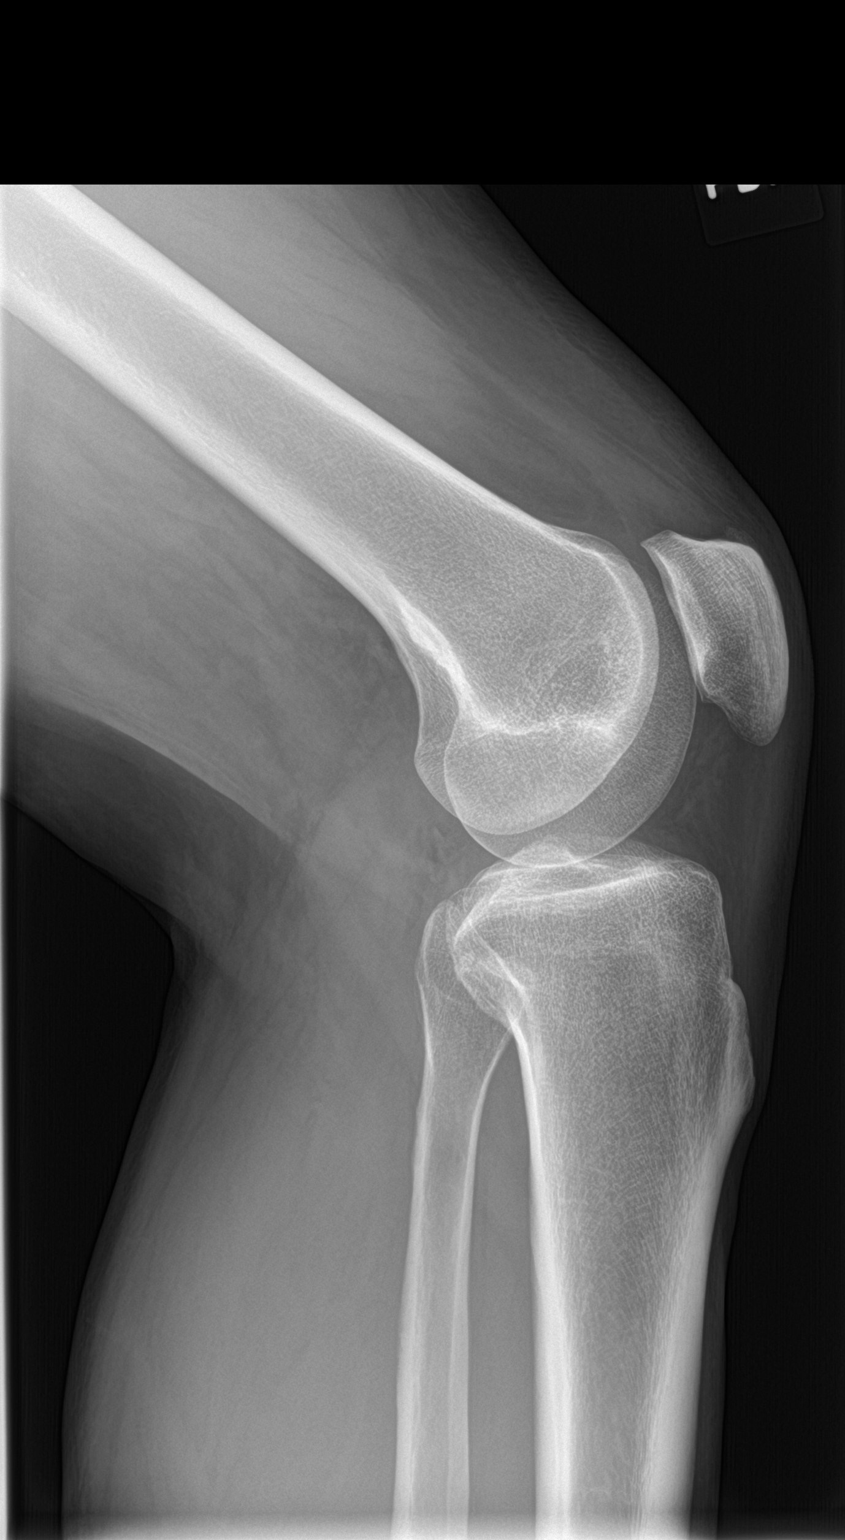

[sunrise]
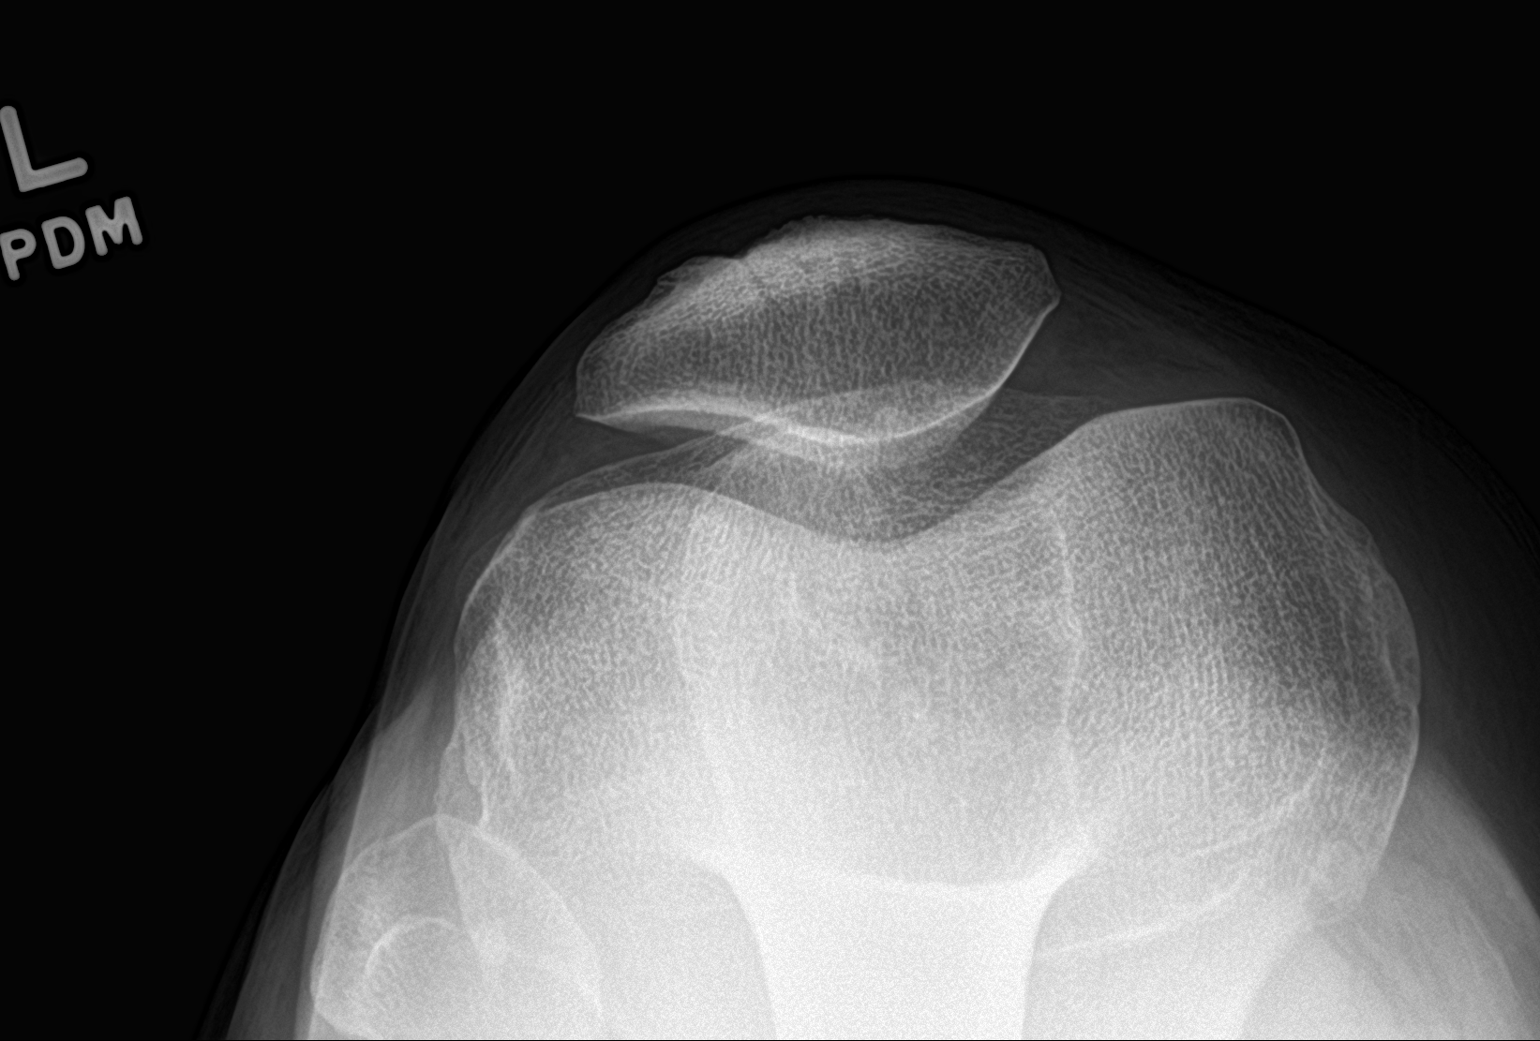

[4 of 4 positions shown; findings below may reference images not displayed]

FINDINGS: No evidence of fracture, dislocation, or joint effusion. No evidence
of arthropathy or other focal bone abnormality. Soft tissues are
unremarkable.
IMPRESSION: Negative.
